# Patient Record
Sex: Male | Born: 1950 | Hispanic: No | Marital: Married | Smoking: Never smoker
Health system: Southern US, Community
[De-identification: ages and names within clinical notes are randomized; demographics above are authoritative.]

## PROBLEM LIST (undated history)

## (undated) DIAGNOSIS — E079 Disorder of thyroid, unspecified: Secondary | ICD-10-CM

## (undated) DIAGNOSIS — K649 Unspecified hemorrhoids: Secondary | ICD-10-CM

## (undated) DIAGNOSIS — M7050 Other bursitis of knee, unspecified knee: Secondary | ICD-10-CM

## (undated) HISTORY — PX: FRACTURE SURGERY: SHX138

## (undated) HISTORY — PX: LACERATION REPAIR: SHX5168

## (undated) HISTORY — DX: Disorder of thyroid, unspecified: E07.9

---

## 2000-04-11 ENCOUNTER — Emergency Department (HOSPITAL_COMMUNITY): Admission: EM | Admit: 2000-04-11 | Discharge: 2000-04-11 | Payer: Self-pay | Admitting: Emergency Medicine

## 2000-04-15 ENCOUNTER — Emergency Department (HOSPITAL_COMMUNITY): Admission: EM | Admit: 2000-04-15 | Discharge: 2000-04-15 | Payer: Self-pay | Admitting: Emergency Medicine

## 2001-08-28 ENCOUNTER — Ambulatory Visit (HOSPITAL_COMMUNITY): Admission: RE | Admit: 2001-08-28 | Discharge: 2001-08-28 | Payer: Self-pay | Admitting: Family Medicine

## 2001-08-28 ENCOUNTER — Encounter: Payer: Self-pay | Admitting: Family Medicine

## 2004-08-18 ENCOUNTER — Ambulatory Visit: Payer: Self-pay | Admitting: Family Medicine

## 2010-04-25 ENCOUNTER — Emergency Department (HOSPITAL_COMMUNITY): Admission: EM | Admit: 2010-04-25 | Discharge: 2010-04-26 | Payer: Self-pay | Admitting: Emergency Medicine

## 2010-05-02 ENCOUNTER — Emergency Department (HOSPITAL_COMMUNITY): Admission: EM | Admit: 2010-05-02 | Discharge: 2010-05-02 | Payer: Self-pay | Admitting: Family Medicine

## 2011-07-29 IMAGING — CR DG HIP COMPLETE 2+V*R*
3 series · 3 of 3 positions shown · non-contrast
Comparison: None.

CLINICAL DATA: 59-year-old male with right hip pain.

RIGHT HIP - COMPLETE 2+ VIEW

[t pelvis a.p.]
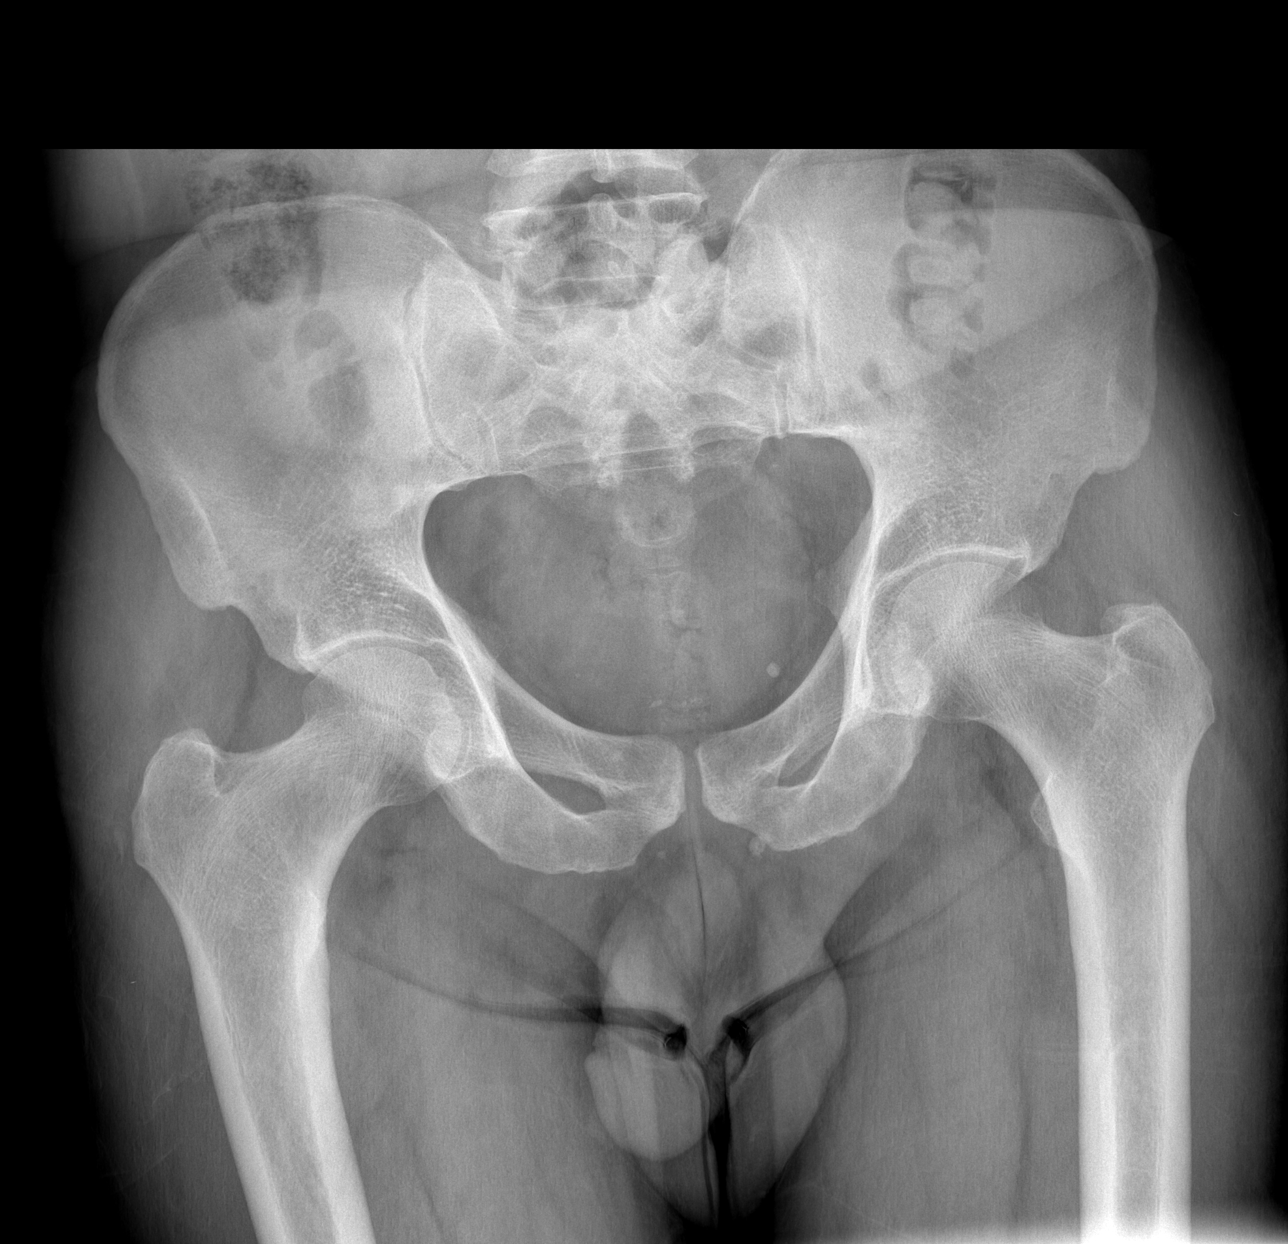

[t hip ap right]
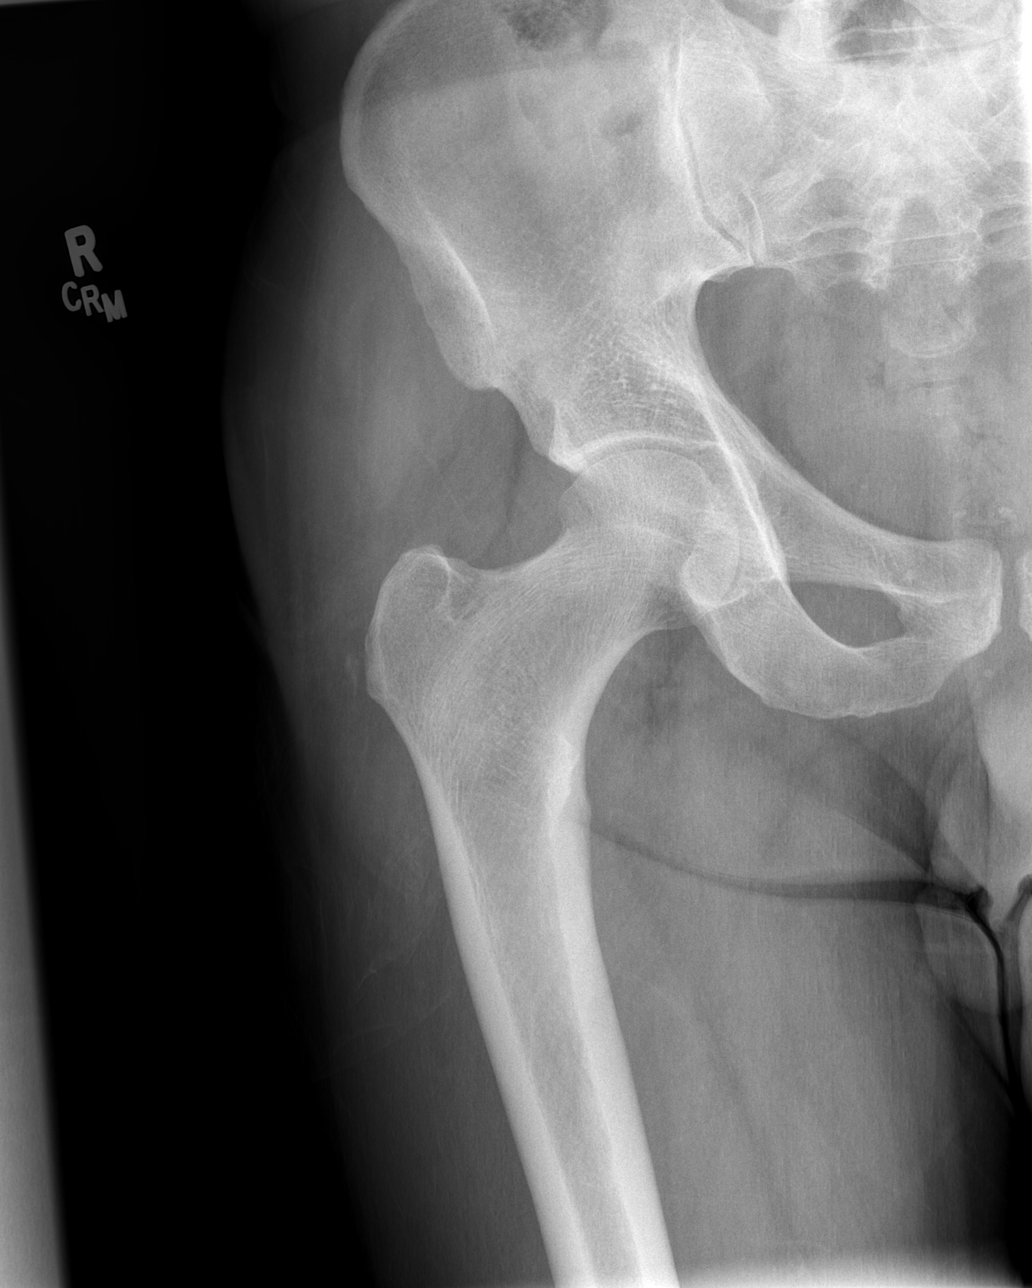

[t hip frog leg right]
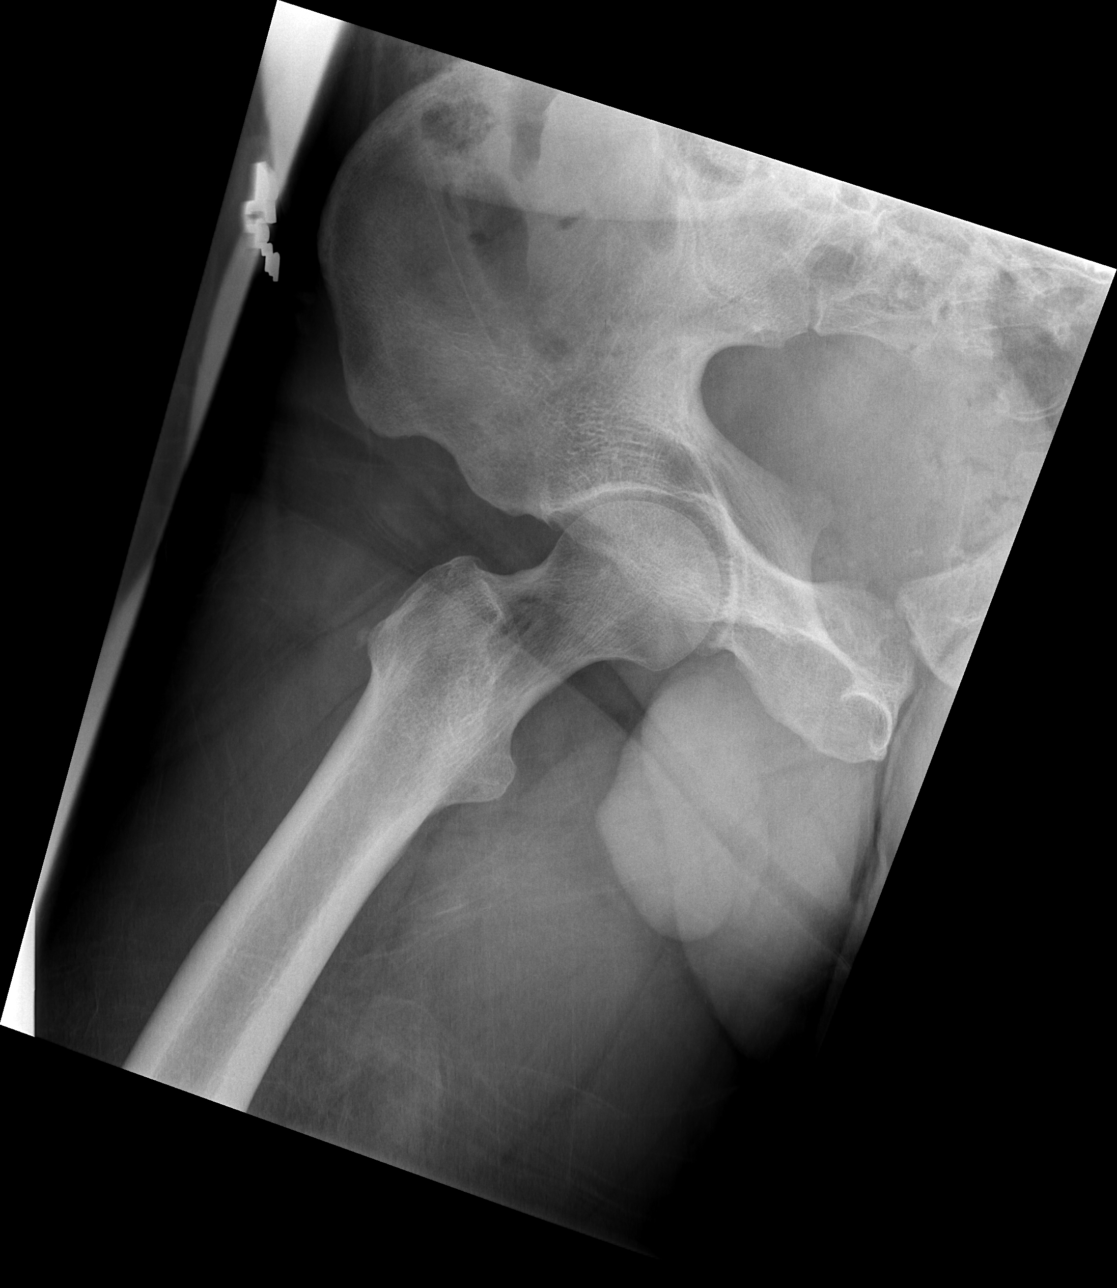

[3 of 3 positions shown; findings below may reference images not displayed]

FINDINGS: Femoral heads are normally located.  Hip joint spaces are
within normal limits.  Pelvic phleboliths. Bone mineralization is
within normal limits. Sacrum and SI joints are within normal
limits.  Proximal visualized right femur appears intact.
IMPRESSION: No acute fracture or dislocation identified about the right hip or
pelvis.

## 2011-10-10 ENCOUNTER — Other Ambulatory Visit: Payer: Self-pay

## 2011-10-10 ENCOUNTER — Encounter (HOSPITAL_COMMUNITY): Payer: Self-pay | Admitting: Anesthesiology

## 2011-10-10 ENCOUNTER — Encounter (HOSPITAL_COMMUNITY): Payer: Self-pay | Admitting: Emergency Medicine

## 2011-10-10 ENCOUNTER — Emergency Department (HOSPITAL_COMMUNITY): Payer: Worker's Compensation | Admitting: Anesthesiology

## 2011-10-10 ENCOUNTER — Ambulatory Visit (HOSPITAL_COMMUNITY)
Admission: EM | Admit: 2011-10-10 | Discharge: 2011-10-11 | Disposition: A | Discharge status: Home / Self Care | Payer: Worker's Compensation | Attending: Orthopedic Surgery | Admitting: Orthopedic Surgery

## 2011-10-10 ENCOUNTER — Emergency Department (HOSPITAL_COMMUNITY): Payer: Worker's Compensation

## 2011-10-10 ENCOUNTER — Encounter (HOSPITAL_COMMUNITY)
Admission: EM | Disposition: A | Discharge status: Home / Self Care | Payer: Self-pay | Source: Home / Self Care | Attending: Emergency Medicine

## 2011-10-10 DIAGNOSIS — Y990 Civilian activity done for income or pay: Secondary | ICD-10-CM | POA: Insufficient documentation

## 2011-10-10 DIAGNOSIS — S61209A Unspecified open wound of unspecified finger without damage to nail, initial encounter: Secondary | ICD-10-CM | POA: Insufficient documentation

## 2011-10-10 DIAGNOSIS — W3189XA Contact with other specified machinery, initial encounter: Secondary | ICD-10-CM | POA: Insufficient documentation

## 2011-10-10 DIAGNOSIS — Y9269 Other specified industrial and construction area as the place of occurrence of the external cause: Secondary | ICD-10-CM | POA: Insufficient documentation

## 2011-10-10 HISTORY — DX: Other bursitis of knee, unspecified knee: M70.50

## 2011-10-10 HISTORY — PX: SKIN DEBRIDEMENT: SHX5235

## 2011-10-10 HISTORY — DX: Unspecified hemorrhoids: K64.9

## 2011-10-10 LAB — CBC
HCT: 39 % (ref 39.0–52.0)
Hemoglobin: 13.8 g/dL (ref 13.0–17.0)
MCH: 30.5 pg (ref 26.0–34.0)
MCHC: 35.4 g/dL (ref 30.0–36.0)
MCV: 86.3 fL (ref 78.0–100.0)
Platelets: 247 10*3/uL (ref 150–400)
RBC: 4.52 MIL/uL (ref 4.22–5.81)
RDW: 12.8 % (ref 11.5–15.5)
WBC: 9.4 10*3/uL (ref 4.0–10.5)

## 2011-10-10 LAB — POCT I-STAT, CHEM 8
BUN: 17 mg/dL (ref 6–23)
Calcium, Ion: 1.22 mmol/L (ref 1.12–1.32)
Chloride: 107 mEq/L (ref 96–112)
Creatinine, Ser: 0.4 mg/dL — ABNORMAL LOW (ref 0.50–1.35)
Glucose, Bld: 110 mg/dL — ABNORMAL HIGH (ref 70–99)
HCT: 41 % (ref 39.0–52.0)
Hemoglobin: 13.9 g/dL (ref 13.0–17.0)
Potassium: 3.9 mEq/L (ref 3.5–5.1)
Sodium: 140 mEq/L (ref 135–145)
TCO2: 24 mmol/L (ref 0–100)

## 2011-10-10 LAB — DIFFERENTIAL
Basophils Absolute: 0 10*3/uL (ref 0.0–0.1)
Basophils Relative: 0 % (ref 0–1)
Eosinophils Absolute: 0.1 10*3/uL (ref 0.0–0.7)
Eosinophils Relative: 1 % (ref 0–5)
Lymphocytes Relative: 22 % (ref 12–46)
Lymphs Abs: 2 10*3/uL (ref 0.7–4.0)
Monocytes Absolute: 0.4 10*3/uL (ref 0.1–1.0)
Monocytes Relative: 4 % (ref 3–12)
Neutro Abs: 6.9 10*3/uL (ref 1.7–7.7)
Neutrophils Relative %: 73 % (ref 43–77)

## 2011-10-10 SURGERY — DEBRIDEMENT, SKIN, FULL-THICKNESS
Anesthesia: General | Site: Finger | Laterality: Right | Wound class: Dirty or Infected

## 2011-10-10 MED ORDER — HYDROMORPHONE HCL PF 1 MG/ML IJ SOLN
1.0000 mg | Freq: Once | INTRAMUSCULAR | Status: AC
  Administered 2011-10-10: 1 mg via INTRAVENOUS
  Filled 2011-10-10: qty 1

## 2011-10-10 MED ORDER — VITAMIN C 500 MG PO TABS
1000.0000 mg | ORAL_TABLET | Freq: Every day | ORAL | Status: DC
  Administered 2011-10-10 – 2011-10-11 (×2): 1000 mg via ORAL
  Filled 2011-10-10 (×2): qty 2

## 2011-10-10 MED ORDER — BUPIVACAINE HCL (PF) 0.25 % IJ SOLN
INTRAMUSCULAR | Status: DC | PRN
  Administered 2011-10-10: 7 mL

## 2011-10-10 MED ORDER — ONDANSETRON HCL 4 MG PO TABS: 4.0000 mg | ORAL_TABLET | Freq: Four times a day (QID) | ORAL | Status: DC | PRN

## 2011-10-10 MED ORDER — HYDROCODONE-ACETAMINOPHEN 5-325 MG PO TABS
1.0000 | ORAL_TABLET | ORAL | Status: DC | PRN
  Administered 2011-10-11: 2 via ORAL
  Filled 2011-10-10: qty 2

## 2011-10-10 MED ORDER — LACTATED RINGERS IV SOLN
INTRAVENOUS | Status: DC | PRN
  Administered 2011-10-10 (×2): via INTRAVENOUS

## 2011-10-10 MED ORDER — ASPIRIN EC 325 MG PO TBEC: 325.0000 mg | DELAYED_RELEASE_TABLET | Freq: Two times a day (BID) | ORAL | Status: AC

## 2011-10-10 MED ORDER — ASPIRIN 325 MG PO TABS
325.0000 mg | ORAL_TABLET | Freq: Two times a day (BID) | ORAL | Status: DC
  Administered 2011-10-10 – 2011-10-11 (×2): 325 mg via ORAL
  Filled 2011-10-10 (×3): qty 1

## 2011-10-10 MED ORDER — DROPERIDOL 2.5 MG/ML IJ SOLN
INTRAMUSCULAR | Status: DC | PRN
  Administered 2011-10-10: .6 mg via INTRAVENOUS

## 2011-10-10 MED ORDER — ONDANSETRON HCL 4 MG/2ML IJ SOLN: 4.0000 mg | Freq: Four times a day (QID) | INTRAMUSCULAR | Status: DC | PRN

## 2011-10-10 MED ORDER — HYDROMORPHONE HCL PF 1 MG/ML IJ SOLN
INTRAMUSCULAR | Status: AC
  Administered 2011-10-10: 1 mg via INTRAVENOUS
  Filled 2011-10-10: qty 1

## 2011-10-10 MED ORDER — DOCUSATE SODIUM 100 MG PO CAPS: 100.0000 mg | ORAL_CAPSULE | Freq: Two times a day (BID) | ORAL | Status: AC

## 2011-10-10 MED ORDER — KCL IN DEXTROSE-NACL 20-5-0.45 MEQ/L-%-% IV SOLN
INTRAVENOUS | Status: DC
  Filled 2011-10-10 (×2): qty 1000

## 2011-10-10 MED ORDER — HYDROMORPHONE HCL PF 1 MG/ML IJ SOLN
1.0000 mg | Freq: Once | INTRAMUSCULAR | Status: AC
  Administered 2011-10-10: 1 mg via INTRAVENOUS

## 2011-10-10 MED ORDER — DOCUSATE SODIUM 100 MG PO CAPS
100.0000 mg | ORAL_CAPSULE | Freq: Two times a day (BID) | ORAL | Status: DC
  Administered 2011-10-10 – 2011-10-11 (×2): 100 mg via ORAL
  Filled 2011-10-10 (×3): qty 1

## 2011-10-10 MED ORDER — HYDROMORPHONE HCL PF 1 MG/ML IJ SOLN: 0.2500 mg | INTRAMUSCULAR | Status: DC | PRN

## 2011-10-10 MED ORDER — DIPHENHYDRAMINE HCL 25 MG PO CAPS: 25.0000 mg | ORAL_CAPSULE | Freq: Four times a day (QID) | ORAL | Status: DC | PRN

## 2011-10-10 MED ORDER — PROPOFOL 10 MG/ML IV BOLUS
INTRAVENOUS | Status: DC | PRN
  Administered 2011-10-10: 200 mg via INTRAVENOUS

## 2011-10-10 MED ORDER — METHOCARBAMOL 100 MG/ML IJ SOLN
500.0000 mg | Freq: Four times a day (QID) | INTRAVENOUS | Status: DC | PRN
  Filled 2011-10-10: qty 5

## 2011-10-10 MED ORDER — CEFAZOLIN SODIUM 1-5 GM-% IV SOLN
1.0000 g | Freq: Once | INTRAVENOUS | Status: AC
  Administered 2011-10-10 (×2): 1 g via INTRAVENOUS
  Filled 2011-10-10: qty 50

## 2011-10-10 MED ORDER — CEPHALEXIN 500 MG PO CAPS: 500.0000 mg | ORAL_CAPSULE | Freq: Four times a day (QID) | ORAL | Status: AC

## 2011-10-10 MED ORDER — MIDAZOLAM HCL 5 MG/5ML IJ SOLN
INTRAMUSCULAR | Status: DC | PRN
  Administered 2011-10-10: 2 mg via INTRAVENOUS

## 2011-10-10 MED ORDER — ONDANSETRON HCL 4 MG/2ML IJ SOLN
INTRAMUSCULAR | Status: DC | PRN
  Administered 2011-10-10: 4 mg via INTRAVENOUS

## 2011-10-10 MED ORDER — CEFAZOLIN SODIUM 1-5 GM-% IV SOLN
1.0000 g | Freq: Three times a day (TID) | INTRAVENOUS | Status: DC
  Administered 2011-10-11: 1 g via INTRAVENOUS
  Filled 2011-10-10 (×4): qty 50

## 2011-10-10 MED ORDER — ZOLPIDEM TARTRATE 5 MG PO TABS: 5.0000 mg | ORAL_TABLET | Freq: Every evening | ORAL | Status: DC | PRN

## 2011-10-10 MED ORDER — ALPRAZOLAM 0.5 MG PO TABS: 0.5000 mg | ORAL_TABLET | Freq: Four times a day (QID) | ORAL | Status: DC | PRN

## 2011-10-10 MED ORDER — HYDROMORPHONE HCL PF 1 MG/ML IJ SOLN
0.5000 mg | INTRAMUSCULAR | Status: DC | PRN
  Administered 2011-10-10 – 2011-10-11 (×2): 1 mg via INTRAVENOUS
  Filled 2011-10-10 (×2): qty 1

## 2011-10-10 MED ORDER — LACTATED RINGERS IV SOLN
INTRAVENOUS | Status: DC
  Administered 2011-10-10: 13:00:00 via INTRAVENOUS

## 2011-10-10 MED ORDER — SODIUM CHLORIDE 0.9 % IV SOLN
Freq: Once | INTRAVENOUS | Status: AC
  Administered 2011-10-10: 09:00:00 via INTRAVENOUS

## 2011-10-10 MED ORDER — ADULT MULTIVITAMIN W/MINERALS CH
1.0000 | ORAL_TABLET | Freq: Every day | ORAL | Status: DC
  Administered 2011-10-11: 1 via ORAL
  Filled 2011-10-10: qty 1

## 2011-10-10 MED ORDER — TETANUS-DIPHTH-ACELL PERTUSSIS 5-2.5-18.5 LF-MCG/0.5 IM SUSP
0.5000 mL | Freq: Once | INTRAMUSCULAR | Status: AC
  Administered 2011-10-10: 0.5 mL via INTRAMUSCULAR
  Filled 2011-10-10: qty 0.5

## 2011-10-10 MED ORDER — FENTANYL CITRATE 0.05 MG/ML IJ SOLN
INTRAMUSCULAR | Status: DC | PRN
  Administered 2011-10-10: 100 ug via INTRAVENOUS

## 2011-10-10 MED ORDER — OXYCODONE-ACETAMINOPHEN 5-325 MG PO TABS: 1.0000 | ORAL_TABLET | ORAL | Status: AC | PRN

## 2011-10-10 MED ORDER — CEFAZOLIN SODIUM 1-5 GM-% IV SOLN
1.0000 g | INTRAVENOUS | Status: AC
  Administered 2011-10-10: 1 g via INTRAVENOUS
  Filled 2011-10-10: qty 50

## 2011-10-10 MED ORDER — METHOCARBAMOL 500 MG PO TABS
500.0000 mg | ORAL_TABLET | Freq: Four times a day (QID) | ORAL | Status: DC | PRN
  Administered 2011-10-10 – 2011-10-11 (×2): 500 mg via ORAL
  Filled 2011-10-10 (×2): qty 1

## 2011-10-10 MED ORDER — OXYCODONE-ACETAMINOPHEN 5-325 MG PO TABS
1.0000 | ORAL_TABLET | ORAL | Status: DC | PRN
  Administered 2011-10-10 – 2011-10-11 (×2): 2 via ORAL
  Filled 2011-10-10 (×3): qty 2

## 2011-10-10 SURGICAL SUPPLY — 54 items
BANDAGE CONFORM 2X5YD N/S (GAUZE/BANDAGES/DRESSINGS) ×2 IMPLANT
BANDAGE ELASTIC 3 VELCRO ST LF (GAUZE/BANDAGES/DRESSINGS) IMPLANT
BANDAGE ELASTIC 4 VELCRO ST LF (GAUZE/BANDAGES/DRESSINGS) IMPLANT
BANDAGE GAUZE ELAST BULKY 4 IN (GAUZE/BANDAGES/DRESSINGS) IMPLANT
BNDG CMPR 9X4 STRL LF SNTH (GAUZE/BANDAGES/DRESSINGS) ×2
BNDG CMPR MD 5X2 ELC HKLP STRL (GAUZE/BANDAGES/DRESSINGS) ×2
BNDG COHESIVE 1X5 TAN STRL LF (GAUZE/BANDAGES/DRESSINGS) ×2 IMPLANT
BNDG ELASTIC 2 VLCR STRL LF (GAUZE/BANDAGES/DRESSINGS) ×3 IMPLANT
BNDG ESMARK 4X9 LF (GAUZE/BANDAGES/DRESSINGS) ×3 IMPLANT
CAP PIN ORTHO PINK (CAP) IMPLANT
CAP PIN PROTECTOR ORTHO WHT (CAP) IMPLANT
CLOTH BEACON ORANGE TIMEOUT ST (SAFETY) ×3 IMPLANT
CORDS BIPOLAR (ELECTRODE) ×3 IMPLANT
COVER SURGICAL LIGHT HANDLE (MISCELLANEOUS) ×5 IMPLANT
CUFF TOURNIQUET SINGLE 18IN (TOURNIQUET CUFF) ×3 IMPLANT
CUFF TOURNIQUET SINGLE 24IN (TOURNIQUET CUFF) IMPLANT
DRAPE OEC MINIVIEW 54X84 (DRAPES) IMPLANT
DRAPE SURG 17X23 STRL (DRAPES) ×3 IMPLANT
DRSG ADAPTIC 3X8 NADH LF (GAUZE/BANDAGES/DRESSINGS) IMPLANT
DRSG EMULSION OIL 3X16 NADH (GAUZE/BANDAGES/DRESSINGS) ×2 IMPLANT
GAUZE SPONGE 2X2 8PLY STRL LF (GAUZE/BANDAGES/DRESSINGS) IMPLANT
GAUZE SPONGE 4X4 12PLY STRL LF (GAUZE/BANDAGES/DRESSINGS) ×2 IMPLANT
GLOVE BIOGEL PI IND STRL 8.5 (GLOVE) ×2 IMPLANT
GLOVE BIOGEL PI INDICATOR 8.5 (GLOVE) ×1
GLOVE SURG ORTHO 8.0 STRL STRW (GLOVE) ×3 IMPLANT
GOWN PREVENTION PLUS XLARGE (GOWN DISPOSABLE) ×3 IMPLANT
GOWN STRL NON-REIN LRG LVL3 (GOWN DISPOSABLE) ×4 IMPLANT
K-WIRE SMTH SNGL TROCAR .028X4 (WIRE)
KIT BASIN OR (CUSTOM PROCEDURE TRAY) ×3 IMPLANT
KIT ROOM TURNOVER OR (KITS) ×1 IMPLANT
KWIRE SMTH SNGL TROCAR .028X4 (WIRE) IMPLANT
MANIFOLD NEPTUNE II (INSTRUMENTS) ×3 IMPLANT
NDL HYPO 25GX1X1/2 BEV (NEEDLE) IMPLANT
NEEDLE HYPO 25GX1X1/2 BEV (NEEDLE) ×3 IMPLANT
NS IRRIG 1000ML POUR BTL (IV SOLUTION) ×3 IMPLANT
PACK ORTHO EXTREMITY (CUSTOM PROCEDURE TRAY) ×3 IMPLANT
PAD ARMBOARD 7.5X6 YLW CONV (MISCELLANEOUS) ×6 IMPLANT
PAD CAST 4YDX4 CTTN HI CHSV (CAST SUPPLIES) IMPLANT
PADDING CAST COTTON 4X4 STRL (CAST SUPPLIES)
PADDING UNDERCAST 2  STERILE (CAST SUPPLIES) ×3 IMPLANT
SOAP 2 % CHG 4 OZ (WOUND CARE) ×3 IMPLANT
SPLINT FINGER 5.25 ALUM (CAST SUPPLIES) ×3
SPLINT FINGER 5/8X5.25 (CAST SUPPLIES) ×1 IMPLANT
SPONGE GAUZE 2X2 STER 10/PKG (GAUZE/BANDAGES/DRESSINGS)
SPONGE GAUZE 4X4 12PLY (GAUZE/BANDAGES/DRESSINGS) ×2 IMPLANT
SUCTION FRAZIER TIP 10 FR DISP (SUCTIONS) ×2 IMPLANT
SUT MERSILENE 4 0 P 3 (SUTURE) IMPLANT
SUT PROLENE 4 0 PS 2 18 (SUTURE) ×6 IMPLANT
SYR CONTROL 10ML LL (SYRINGE) ×2 IMPLANT
TOWEL OR 17X24 6PK STRL BLUE (TOWEL DISPOSABLE) ×3 IMPLANT
TOWEL OR 17X26 10 PK STRL BLUE (TOWEL DISPOSABLE) ×3 IMPLANT
TUBE CONNECTING 12X1/4 (SUCTIONS) ×2 IMPLANT
UNDERPAD 30X30 INCONTINENT (UNDERPADS AND DIAPERS) ×3 IMPLANT
WATER STERILE IRR 1000ML POUR (IV SOLUTION) ×1 IMPLANT

## 2011-10-10 NOTE — ED Notes (Signed)
Family at bedside. 

## 2011-10-10 NOTE — ED Notes (Signed)
PT FAMILY RESTLESS AT BEDSIDE. VERBALIZING CONCERN ABOUT DELAY IN GETTING SURGEON HERE TO SEE PT. REASSURANCE GIVEN. PT IS READY TO GO TO OR . WAITING FOR DR Melvyn Novas TO ARRIVE AND CALL FROM OR.

## 2011-10-10 NOTE — ED Notes (Signed)
LAB AT BEDSIDE TO COLLECT SAMPLES. 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

## 2011-10-10 NOTE — ED Notes (Signed)
AWAITING PT ARRIVAL FROM [place]

## 2011-10-10 NOTE — Anesthesia Postprocedure Evaluation (Signed)
  Anesthesia Post-op Note  Patient: Tyler Reed  Procedure(s) Performed:  DEBRIDEMENT SKIN FULL THICKNESS - Right Index Finger  Patient Location: PACU  Anesthesia Type: General  Level of Consciousness: awake and alert   Airway and Oxygen Therapy: Patient Spontanous Breathing  Post-op Pain: mild  Post-op Assessment: Post-op Vital signs reviewed, Patient's Cardiovascular Status Stable, Respiratory Function Stable, Patent Airway and No signs of Nausea or vomiting  Post-op Vital Signs: Reviewed and stable  Complications: No apparent anesthesia complications

## 2011-10-10 NOTE — ED Provider Notes (Signed)
History     CSN: [id number]  Arrival date & time 10/10/11  4696   None     Chief Complaint  Patient presents with  . Hand Injury    (Consider location/radiation/quality/duration/timing/severity/associated sxs/prior treatment) Patient is a 61 y.o. male presenting with hand injury. The history is provided by the patient.  Hand Injury  The incident occurred less than 1 hour ago. The incident occurred at work. Injury mechanism: right index finger in roller at work. The quality of the pain is described as throbbing. The pain is severe. The pain has been constant since the incident.    No past medical history on file.  No past surgical history on file.  No family history on file.  History  Substance Use Topics  . Smoking status: Not on file  . Smokeless tobacco: Not on file  . Alcohol Use: Not on file      Review of Systems  All other systems reviewed and are negative.    Allergies  Review of patient's allergies indicates not on file.  Home Medications  No current outpatient prescriptions on file.  BP 154/112  Pulse 99  Temp(Src) 98.1 F (36.7 C) (Oral)  Resp 20  SpO2 100%  Physical Exam  Nursing note and vitals reviewed. Constitutional: He is oriented to person, place, and time. He appears well-developed and well-nourished.  HENT:  Head: Normocephalic and atraumatic.  Neck: Normal range of motion. Neck supple.  Cardiovascular: Normal rate and regular rhythm.  Exam reveals no friction rub.   Pulmonary/Chest: Effort normal. No respiratory distress.  Musculoskeletal:       The right index finger has an extensive degloving of the skin, including nail from above the PIP joint.  It appears to be mainly skin but does not involve the tendons, bone.  Neurological: He is alert and oriented to person, place, and time.  Skin: Skin is warm.    ED Course  Procedures (including critical care time)  Labs Reviewed - No data to display No results found.   No  diagnosis found.    MDM  Patient will be transferred to [facility] at the request of Dr. Melvyn Novas.  Has received tetanus immunization, kefzol, and pain meds.          Geoffery Lyons, MD 10/10/11 [phone]

## 2011-10-10 NOTE — ED Notes (Addendum)
Pt arrived with skin tip and nail of right 1st digit in ziploc bag,Pt states right 1st digit caught in roller at work.

## 2011-10-10 NOTE — Anesthesia Preprocedure Evaluation (Addendum)
Anesthesia Evaluation  Patient identified by MRN, date of birth, ID band Patient awake    Reviewed: Allergy & Precautions, H&P , NPO status , Patient's Chart, lab work & pertinent test results  Airway Mallampati: II  Neck ROM: full    Dental  (+) Missing and Dental Advisory Given   Pulmonary          Cardiovascular     Neuro/Psych    GI/Hepatic   Endo/Other    Renal/GU      Musculoskeletal   Abdominal   Peds  Hematology   Anesthesia Other Findings   Reproductive/Obstetrics                          Anesthesia Physical Anesthesia Plan  ASA: I  Anesthesia Plan: General   Post-op Pain Management:    Induction: Intravenous  Airway Management Planned: LMA  Additional Equipment:   Intra-op Plan:   Post-operative Plan:   Informed Consent: I have reviewed the patients History and Physical, chart, labs and discussed the procedure including the risks, benefits and alternatives for the proposed anesthesia with the patient or authorized representative who has indicated his/her understanding and acceptance.     Plan Discussed with: CRNA and Surgeon  Anesthesia Plan Comments:         Anesthesia Quick Evaluation

## 2011-10-10 NOTE — ED Notes (Signed)
DR Melvyn Novas AT BEDSIDE

## 2011-10-10 NOTE — ED Notes (Signed)
PT ASSISTED TO GET UNDRESSED IN PREPARATION FOR OR TODAY. THE DEGLOVED PART IS IN A PLASTIC BAG ON COLD ICED WATER. DR Melvyn Novas IS AWARE OF HOW DEGLOVED PART IS PACKAGED AND IS GOOD WITH THAT.

## 2011-10-10 NOTE — ED Notes (Signed)
REPORT CALLED TO ROBBIE RN

## 2011-10-10 NOTE — ED Provider Notes (Signed)
10:30 AM MSE. Pt arrived from [facility] ED for specialized care from Dr Melvyn Novas for a degloving injury to the right index finger that occurred at work just prior to his arrival at that facility. A&O x 3. Lungs CTAB. Heart RRR. Abd s/NT/ND. Right hand with bulky dressing place, not removed. Degloved fingertip in basin on ice at bedside. Pt in no visible distress. Refuses additional pain medication. Dr Melvyn Novas is being notified of his arrival.     12:30PM Dr Melvyn Novas in ED, speaking with pt and family.  Shaaron Adler, PA-C 10/10/11 1239

## 2011-10-10 NOTE — Transfer of Care (Signed)
Immediate Anesthesia Transfer of Care Note  Patient: Tyler Reed  Procedure(s) Performed:  DEBRIDEMENT SKIN FULL THICKNESS - Right Index Finger  Patient Location: PACU  Anesthesia Type: General  Level of Consciousness: awake  Airway & Oxygen Therapy: Patient Spontanous Breathing  Post-op Assessment: Report given to PACU RN and Post -op Vital signs reviewed and stable  Post vital signs: Reviewed and stable  Complications: No apparent anesthesia complications

## 2011-10-10 NOTE — ED Notes (Signed)
CALLED DR Melvyn Novas TO INQUIRE IF CXR OR ECG OR LABS WOULD BE REQUIRED FOR PT TO GO TO OPERATING ROOM. HE ADVISES "I WILL CALL WHEN I AM DONE". NO ORDERS GIVEN.

## 2011-10-10 NOTE — ED Notes (Signed)
PT HAS ARRIVED FROM [place] VIA [facility]

## 2011-10-10 NOTE — Anesthesia Procedure Notes (Signed)
Procedure Name: LMA Insertion Date/Time: 10/10/2011 3:18 PM Performed by: Alanda Amass Pre-anesthesia Checklist: Patient identified, Timeout performed, Emergency Drugs available, Suction available and Patient being monitored Patient Re-evaluated:Patient Re-evaluated prior to inductionOxygen Delivery Method: Circle System Utilized Preoxygenation: Pre-oxygenation with 100% oxygen Intubation Type: IV induction Ventilation: Mask ventilation without difficulty LMA: LMA inserted LMA Size: 4.0 Number of attempts: 1 Placement Confirmation: positive ETCO2 and breath sounds checked- equal and bilateral Tube secured with: Tape Dental Injury: Teeth and Oropharynx as per pre-operative assessment

## 2011-10-10 NOTE — H&P (Signed)
Raynard Hilaire is an 61 y.o. male.   Chief Complaint: RIGHT INDEX FINGER DEGLOVING HPI: RIGHT INDEX FINGER GOT CAUGHT BETWEEN TWO ROLLERS PT SEEN EVALUATED IN ED TRANSFERRED TO [facility] FOR DEFINITIVE MANAGEMENT RECEIVED TETANUS IV ANTIBIOTICS  History reviewed. No pertinent past medical history.  History reviewed. No pertinent past surgical history.  History reviewed. No pertinent family history. Social History:  reports that he has never smoked. He has never used smokeless tobacco. He reports that he drinks about 2.4 ounces of alcohol per week. He reports that he does not use illicit drugs.  Allergies: No Known Allergies  Medications Prior to Admission  Medication Dose Route Frequency Provider Last Rate Last Dose  . 0.9 %  sodium chloride infusion   Intravenous Once Geoffery Lyons, MD 1,000 mL/hr at 10/10/11 0854    . ceFAZolin (ANCEF) IVPB 1 g/50 mL premix  1 g Intravenous Once Geoffery Lyons, MD   1 g at 10/10/11 0844  . HYDROmorphone (DILAUDID) injection 1 mg  1 mg Intravenous Once Geoffery Lyons, MD   1 mg at 10/10/11 0844  . HYDROmorphone (DILAUDID) injection 1 mg  1 mg Intravenous Once Geoffery Lyons, MD   1 mg at 10/10/11 0917  . HYDROmorphone (DILAUDID) injection 1 mg  1 mg Intravenous Once Geoffery Lyons, MD   1 mg at 10/10/11 1243  . TDaP (BOOSTRIX) injection 0.5 mL  0.5 mL Intramuscular Once Geoffery Lyons, MD   0.5 mL at 10/10/11 0844   No current outpatient prescriptions on file as of 10/10/2011.    Results for orders placed during the hospital encounter of 10/10/11 (from the past 48 hour(s))  CBC     Status: Normal   Collection Time   10/10/11 12:18 PM      Component Value Range Comment   WBC 9.4  4.0 - 10.5 (K/uL)    RBC 4.52  4.22 - 5.81 (MIL/uL)    Hemoglobin 13.8  13.0 - 17.0 (g/dL)    HCT 16.1  09.6 - 04.5 (%)    MCV 86.3  78.0 - 100.0 (fL)    MCH 30.5  26.0 - 34.0 (pg)    MCHC 35.4  30.0 - 36.0 (g/dL)    RDW 40.9  81.1 - 91.4 (%)    Platelets 247  150 - 400 (K/uL)     DIFFERENTIAL     Status: Normal   Collection Time   10/10/11 12:18 PM      Component Value Range Comment   Neutrophils Relative 73  43 - 77 (%)    Neutro Abs 6.9  1.7 - 7.7 (K/uL)    Lymphocytes Relative 22  12 - 46 (%)    Lymphs Abs 2.0  0.7 - 4.0 (K/uL)    Monocytes Relative 4  3 - 12 (%)    Monocytes Absolute 0.4  0.1 - 1.0 (K/uL)    Eosinophils Relative 1  0 - 5 (%)    Eosinophils Absolute 0.1  0.0 - 0.7 (K/uL)    Basophils Relative 0  0 - 1 (%)    Basophils Absolute 0.0  0.0 - 0.1 (K/uL)   POCT I-STAT, CHEM 8     Status: Abnormal   Collection Time   10/10/11 12:34 PM      Component Value Range Comment   Sodium 140  135 - 145 (mEq/L)    Potassium 3.9  3.5 - 5.1 (mEq/L)    Chloride 107  96 - 112 (mEq/L)    BUN 17  6 -  23 (mg/dL)    Creatinine, Ser 8.29 (*) 0.50 - 1.35 (mg/dL)    Glucose, Bld 562 (*) 70 - 99 (mg/dL)    Calcium, Ion 1.30  1.12 - 1.32 (mmol/L)    TCO2 24  0 - 100 (mmol/L)    Hemoglobin 13.9  13.0 - 17.0 (g/dL)    HCT 86.5  78.4 - 69.6 (%)    Dg Hand Complete Right  10/10/2011  *RADIOLOGY REPORT*  Clinical Data: Caught hand in machine rate today  RIGHT HAND - COMPLETE 3+ VIEW  Comparison: None.  Findings: There is marked irregularity and loss of of the soft tissues of the right second digit secondary to the injury. However, no acute fracture is seen.  Alignment is normal.  There are degenerative changes throughout the DIP joints.  Irregularity of the right fifth distal phalanx is due to prior injury.  IMPRESSION:  1.  Loss of much of the soft tissues of the right second digit.  No underlying fracture. 2.  Old deformity of the distal phalanx of the right fifth digit.  Original Report Authenticated By: Juline Patch, M.D.    NO RECENT ILLNESSES OR HOSPITALIZATIONS  Blood pressure 127/71, pulse 65, temperature 98 F (36.7 C), temperature source Oral, resp. rate 14, SpO2 95.00%. General Appearance:  Alert, cooperative, no distress, appears stated age  Head:   Normocephalic, without obvious abnormality, atraumatic  Eyes:  Pupils equal, conjunctiva/corneas clear,         Throat: Lips, mucosa, and tongue normal; teeth and gums normal  Neck: No visible masses     Lungs:   respirations unlabored  Chest Wall:  No tenderness or deformity  Heart:  Regular rate and rhythm,  Abdomen:   Soft, non-tender,         Extremities: RIGHT INDEX FINGER COMPLETE SKIN DEGLOVING FROM TIP TO MP JOINT. FLEXOR AND EXTENSOR MECHANISM INTACT. ABLE TO FLEX DIP AND PIP JOINT SKIN BROUGHT IN AND WELL KEPT AND LOOKS GOOD NO INJURY TO LONG/RING/SMALL  Pulses: 2+ and symmetric  Skin: Skin color, texture, turgor normal, no rashes or lesions     Neurologic: Normal    Assessment/Plan RIGHT INDEX FINGER SKIN DEGLOVING INJURY FROM MP JOINT DISTALLY WITH EXPOSED EXTENSOR AND FLEXOR TENDONS.  RIGHT INDEX DEBRIDEMENT AND APPLICATION OF SKIN GRAFT, AUTOGENOUS VERSUS ALLOGRAFT WILL NEED FURTHER DEFINITIVE SKIN GRAFTING/FULL THICKNESS GRAFTING  R/B/A DISCUSSED WITH PT IN ED.  PT VOICED UNDERSTANDING OF PLAN CONSENT SIGNED DAY OF SURGERY PT SEEN AND EXAMINED PRIOR TO OPERATIVE PROCEDURE/DAY OF SURGERY SITE MARKED. QUESTIONS ANSWERED WILL STAY IN HOSPITAL AFTER SURGERY  Sharma Covert 10/10/2011, 1:00 PM

## 2011-10-10 NOTE — Brief Op Note (Signed)
10/10/2011  4:21 PM  PATIENT:  Tyler Reed  61 y.o. male  PRE-OPERATIVE DIAGNOSIS:  right index finger degloving  POST-OPERATIVE DIAGNOSIS:  right index finger degloving  PROCEDURE:  Procedure(s): DEBRIDEMENT SKIN FULL THICKNESS  SURGEON:  Surgeon(s): Sharma Covert, MD  PHYSICIAN ASSISTANT:   ASSISTANTS: none   ANESTHESIA:   general  EBL:  Total I/O In: 1450 [I.V.:1450] Out: -   BLOOD ADMINISTERED:none  DRAINS: none   LOCAL MEDICATIONS USED:  MARCAINE 7CC  SPECIMEN:  No Specimen  DISPOSITION OF SPECIMEN:  N/A  COUNTS:  YES  TOURNIQUET:   Total Tourniquet Time Documented: Upper Arm (Right) - 29 minutes  DICTATION: .Other Dictation: Dictation Number [id number]  PLAN OF CARE: Admit for overnight observation  PATIENT DISPOSITION:  PACU - hemodynamically stable.   Delay start of Pharmacological VTE agent (>24hrs) due to surgical blood loss or risk of bleeding:  {YES/NO/NOT APPLICABLE:20182

## 2011-10-11 NOTE — ED Provider Notes (Signed)
Medical screening examination/treatment/procedure(s) were conducted as a shared visit with non-physician practitioner(s) and myself.  I personally evaluated the patient during the encounter  Toy Baker, MD 10/11/11 [phone]

## 2011-10-11 NOTE — Op Note (Signed)
NAMEHITOSHI, Tyler Reed NO.:  [id number]  MEDICAL RECORD NO.:  [id number]  LOCATION:  5041                         FACILITY:  [facility]  PHYSICIAN:  Madelynn Done, MD  DATE OF BIRTH:  1950/11/09  DATE OF PROCEDURE:  10/10/2011 DATE OF DISCHARGE:                              OPERATIVE REPORT   PREOPERATIVE DIAGNOSIS:  Right index finger degloving injury.  POSTOPERATIVE DIAGNOSIS:  Right index finger degloving injury.  ATTENDING PHYSICIAN:  Madelynn Done, MD, who scrubbed and present for the entire procedure.  ASSISTANT SURGEON:  None.  ANESTHESIA:  General via LMA.  TOURNIQUET TIME:  Less than 20 minutes at 250 mmHg.  SURGICAL PROCEDURE: 1. Excisional debridement of skin, subcutaneous tissue, and muscle of     right index finger. 2. Right index finger dermal autograft, 8 x 2 cm defect.  SURGICAL INDICATIONS:  Tyler Reed is a right-hand-dominant gentleman who was at work and got his finger caught between a roller, completely degloved the skin.  The patient brought the skin in.  The patient was seen and evaluated in the emergency department.  Given his injuries, the patient is recommended to be taken to the operating room.  Risks, benefits, and alternatives were discussed in detail with the patient and a signed informed consent was obtained.  Risks include but not limited to bleeding, infection, damage to nearby nerves, arteries, or tendons, loss of motion of the wrist and digits, and need for further surgical intervention.  DESCRIPTION OF PROCEDURE:  The patient was appropriately identified in the preop holding area and a mark with permanent marker was made on the right index finger to indicate the correct operative site.  The patient was then brought back to the operating room and placed supine on the anesthesia room table.  General anesthesia was administered.  The patient tolerated this well.  A well-padded tourniquet was then placed on the right  brachium and sealed with 1000 drape.  Right upper extremity was prepped and draped in a normal sterile fashion.  A time-out was called and the correct side was identified and procedure was then begun. Attention was then turned to the right index finger where excisional debridement was then carried out sharply with sharp scissors of the skin, subcutaneous tissue, and portion of the devitalized paratenon and flexor sheath.  Careful attention was made to preserve the pulleys.  The entire flexor sheath was in continuity.  The neurovascular bundles both radially and ulnarly were in continuity.  There was only skin loss.  The extensor mechanism was without defect.  The defect extended from the MP joint all the way to the tip of the finger.  After excisional debridement, removed skin was prepared with sharp defatting taking some of the fat from the skin.  After excision of the fat, the full-thickness graft was then adequately nicely inset over the finger.  This dermal autograft was then sewn down and tacked circumferentially around the finger with 4-0 Prolene suture.  Following this, 7 mL of 0.25% Marcaine was infiltrated locally.  Adaptic dressing was then applied.  A sterile compressive bandage was then applied.  The patient tolerated the procedure  well, was placed in the small finger splint, extubated and taken to the recovery room in good condition.  POSTOPERATIVE PLAN:  The patient is being admitted overnight for IV antibiotics and pain control.  There is a very guarded prognosis on the viability of this autograft.  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  There will have to be a decision made about whether or not the patient wants to keep and maintain the finger which I think is a very reasonable which will be my first party, but he will likely require a full-thickness likely tubularized groin flap in order to cover the soft tissue defect and taking the index finger and putting it  into a pocket and then coming back and dividing the soft tissues in order to give him a nice full-thickness graft.  Again, the neurovascular bundles were in continuity.  The flexor sheath was in good condition.  There were small areas of injury to the flexor sheath distally around the region of the A4 pulley and the extensor mechanism looked pristine.  We will try to arrange the appropriate care.     Madelynn Done, MD     FWO/MEDQ  D:  10/10/2011  T:  10/11/2011  Job:  [id number]

## 2011-10-12 ENCOUNTER — Encounter (HOSPITAL_COMMUNITY): Payer: Self-pay | Admitting: Orthopedic Surgery

## 2011-10-12 MED FILL — KCl 20 MEQ/L (0.15%) in Dextrose 5% & NaCl 0.45% Inj: INTRAVENOUS | Qty: 1000 | Status: AC

## 2013-05-15 ENCOUNTER — Other Ambulatory Visit: Payer: Self-pay | Admitting: Nurse Practitioner

## 2013-05-15 DIAGNOSIS — E0590 Thyrotoxicosis, unspecified without thyrotoxic crisis or storm: Secondary | ICD-10-CM

## 2013-05-19 ENCOUNTER — Ambulatory Visit
Admission: RE | Admit: 2013-05-19 | Discharge: 2013-05-19 | Disposition: A | Discharge status: Home / Self Care | Payer: PRIVATE HEALTH INSURANCE | Source: Ambulatory Visit | Attending: Nurse Practitioner | Admitting: Nurse Practitioner

## 2013-05-19 DIAGNOSIS — E0590 Thyrotoxicosis, unspecified without thyrotoxic crisis or storm: Secondary | ICD-10-CM

## 2013-06-20 ENCOUNTER — Other Ambulatory Visit: Payer: Self-pay | Admitting: Endocrinology

## 2013-06-20 DIAGNOSIS — E0590 Thyrotoxicosis, unspecified without thyrotoxic crisis or storm: Secondary | ICD-10-CM

## 2013-07-07 ENCOUNTER — Encounter (HOSPITAL_COMMUNITY)
Admission: RE | Admit: 2013-07-07 | Discharge: 2013-07-07 | Disposition: A | Discharge status: Home / Self Care | Payer: PRIVATE HEALTH INSURANCE | Source: Ambulatory Visit | Attending: Endocrinology | Admitting: Endocrinology

## 2013-07-07 DIAGNOSIS — E0590 Thyrotoxicosis, unspecified without thyrotoxic crisis or storm: Secondary | ICD-10-CM | POA: Insufficient documentation

## 2013-07-08 ENCOUNTER — Encounter (HOSPITAL_COMMUNITY)
Admission: RE | Admit: 2013-07-08 | Discharge: 2013-07-08 | Disposition: A | Discharge status: Home / Self Care | Payer: PRIVATE HEALTH INSURANCE | Source: Ambulatory Visit | Attending: Endocrinology | Admitting: Endocrinology

## 2013-07-18 ENCOUNTER — Other Ambulatory Visit: Payer: Self-pay | Admitting: Endocrinology

## 2013-07-18 DIAGNOSIS — E0590 Thyrotoxicosis, unspecified without thyrotoxic crisis or storm: Secondary | ICD-10-CM

## 2013-08-07 ENCOUNTER — Encounter (HOSPITAL_COMMUNITY)
Admission: RE | Admit: 2013-08-07 | Discharge: 2013-08-07 | Disposition: A | Discharge status: Home / Self Care | Payer: PRIVATE HEALTH INSURANCE | Source: Ambulatory Visit | Attending: Endocrinology | Admitting: Endocrinology

## 2013-08-07 DIAGNOSIS — E0590 Thyrotoxicosis, unspecified without thyrotoxic crisis or storm: Secondary | ICD-10-CM

## 2013-08-07 MED ORDER — SODIUM IODIDE I 131 CAPSULE
16.0000 | Freq: Once | INTRAVENOUS | Status: AC | PRN
  Administered 2013-08-07: 17.1 via ORAL

## 2014-10-10 IMAGING — NM NM THYROID UPTAKE SINGLE (24 HR)
1 series · 1 of 1 positions shown · non-contrast
Comparison: None.

RADIOPHARMACEUTICALS:  Fifteen uXi9-PGP Lazy

CLINICAL DATA: Hyperthyroidism.  TSH equal

EXAM:
ALON THYROID UPTAKE (24 HOURS)
TECHNIQUE: Following the oral administration of the radiopharmaceutical, 24
hour radioactive iodine uptake was calculated with the uptake probe
entered on the neck.

[static - general purpose · 1 of 1 slices shown]
[im 1/1]
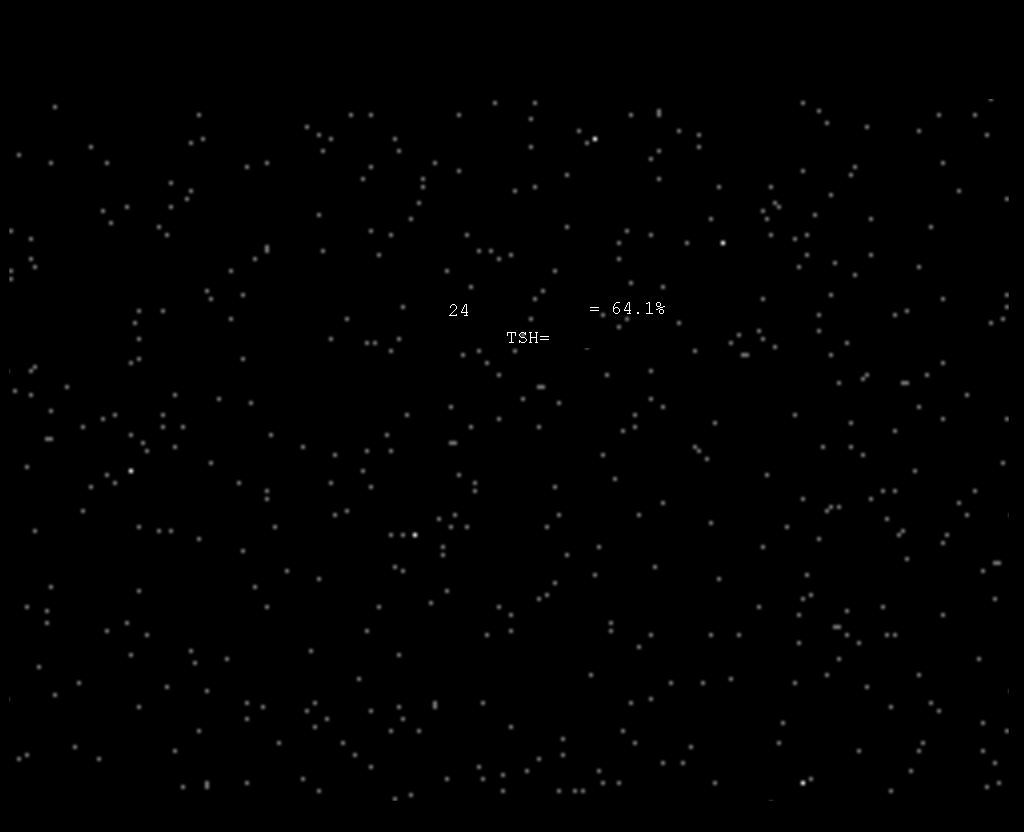

[1 of 1 positions shown; findings below may reference images not displayed]

FINDINGS: 24 hour I 131 uptake = 64.1% (normal 10-30%)
IMPRESSION: 24 hour I 131 uptake = 64.1% (normal 10-30%)

## 2014-11-09 IMAGING — NM NM RAI THERAPY FOR HYPERTHYROIDISM
1 series · 1 of 1 positions shown · non-contrast
Comparison: none

CLINICAL DATA: Hyperthyroidism

EXAM:
RADIOACTIVE IODINE THERAPY FOR HYPERTHYROIDISM
TECHNIQUE: Radioactive iodine prescribed by myself. The risks and benefits of
radioactive iodine therapy were discussed with the patient in detail
by Dr. Quirijn. Alternative therapies were also mentioned.
Radiation safety was discussed with the patient, including how to
protect the general public from exposure. There were no barriers to
communication. Written consent was obtained. The patient then
received a capsule containing the radiopharmaceutical.
The patient will follow-up with the referring physician.
RADIOPHARMACEUTICALS:  17.1 mCi Q-TXT sodium iodide.

[Series 0: (id) static · 1.17mm/px · 1 of 1 slices shown]
[im 1/1]
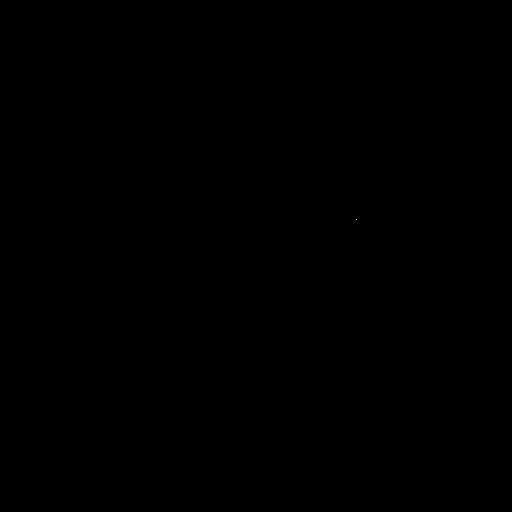

[1 of 1 positions shown; findings below may reference images not displayed]

IMPRESSION: Per oral administration of Q-TXT sodium iodide for the treatment of
hyperthyroidism.

## 2017-06-03 ENCOUNTER — Encounter: Payer: Self-pay | Admitting: Physician Assistant

## 2017-06-03 ENCOUNTER — Ambulatory Visit (INDEPENDENT_AMBULATORY_CARE_PROVIDER_SITE_OTHER): Payer: Managed Care, Other (non HMO)

## 2017-06-03 ENCOUNTER — Ambulatory Visit (INDEPENDENT_AMBULATORY_CARE_PROVIDER_SITE_OTHER): Payer: Managed Care, Other (non HMO) | Admitting: Physician Assistant

## 2017-06-03 VITALS — BP 142/78 | HR 64 | Temp 98.3°F | Resp 18 | Ht 61.42 in | Wt 164.2 lb

## 2017-06-03 DIAGNOSIS — M25561 Pain in right knee: Secondary | ICD-10-CM

## 2017-06-03 DIAGNOSIS — M25461 Effusion, right knee: Secondary | ICD-10-CM | POA: Diagnosis not present

## 2017-06-03 NOTE — Progress Notes (Signed)
    06/03/2017 10:59 AM   DOB: [birth year] / MRN: [id number]  SUBJECTIVE:  Tyler Reed is a 66 y.o. male presenting for right knee pain that started 4 days ago.  Tells me he has had an effusion in the past.  He denies pain at this time. Denies history a history gout and no fever with this.  Did try to put osme fresh cabbage on the knee and this seemed help.    He has No Known Allergies.   He  has a past medical history of Bursitis, knee; Hemorrhoid; and Thyroid disease.    He  reports that he has never smoked. He has never used smokeless tobacco. He reports that he drinks about 2.4 oz of alcohol per week . He reports that he does not use drugs. He  reports that he currently engages in sexual activity. The patient  has a past surgical history that includes Skin debridement (10/10/11); Laceration repair; Skin debridement (10/10/2011); and Fracture surgery.  His family history is not on file.  ROS  The problem list and medications were reviewed and updated by myself where necessary and exist elsewhere in the encounter.   OBJECTIVE:  BP (!) 142/78   Pulse 64   Temp 98.3 F (36.8 C) (Oral)   Resp 18   Ht 5' 1.42" (1.56 m)   Wt 164 lb 3.2 oz (74.5 kg)   SpO2 96%   BMI 30.61 kg/m   Lab Results  Component Value Date   CREATININE 0.40 (L) 10/10/2011     Physical Exam  Constitutional: He appears well-developed. He is active and cooperative.  Non-toxic appearance.  Cardiovascular: Normal rate, regular rhythm, S1 normal, S2 normal, normal heart sounds, intact distal pulses and normal pulses.  Exam reveals no gallop and no friction rub.   No murmur heard. Pulmonary/Chest: Effort normal. No tachypnea. He has no rales.  Abdominal: He exhibits no distension.  Musculoskeletal: He exhibits no edema.  Neurological: He is alert.  Skin: Skin is warm and dry. He is not diaphoretic. No pallor.  Vitals reviewed.   No results found for this or any previous visit (from the past 72 hour(s)).  Dg  Knee Complete 4 Views Right  Result Date: 06/03/2017 CLINICAL DATA:  Right knee pain and swelling EXAM: RIGHT KNEE - COMPLETE 4+ VIEW COMPARISON:  04/26/2010 FINDINGS: No evidence of fracture, dislocation, or joint effusion. No evidence of arthropathy or other focal bone abnormality. Soft tissues are unremarkable. IMPRESSION: No acute osseous finding Electronically Signed   By: Judie Petit.  Shick M.D.   On: 06/03/2017 10:46    ASSESSMENT AND PLAN:  Derion was seen today for knee pain.  Diagnoses and all orders for this visit:  Pain and swelling of knee, right: This has largely resolved with the application of cabbage.  I have advised that he continue this practice, and to use Ibuprofen per avs.  -     DG Knee Complete 4 Views Right    The patient is advised to call or return to clinic if he does not see an improvement in symptoms, or to seek the care of the closest emergency department if he worsens with the above plan.   Deliah Boston, MHS, PA-C [facility] 06/03/2017 10:59 AM

## 2017-06-03 NOTE — Patient Instructions (Addendum)
   tyake 200-600 mg of Ibuprofen every 8 hours as needed for pain and swelling.    IF you received an x-ray today, you will receive an invoice from [facility]. Please contact [facility] at [phone] with questions or concerns regarding your invoice.   IF you received labwork today, you will receive an invoice from [facility]. Please contact [facility] at [phone] with questions or concerns regarding your invoice.   Our billing staff will not be able to assist you with questions regarding bills from these companies.  You will be contacted with the lab results as soon as they are available. The fastest way to get your results is to activate your My Chart account. Instructions are located on the last page of this paperwork. If you have not heard from [place] regarding the results in 2 weeks, please contact this office.

## 2017-06-13 ENCOUNTER — Encounter: Payer: Managed Care, Other (non HMO) | Admitting: Physician Assistant

## 2017-06-13 ENCOUNTER — Telehealth: Payer: Self-pay | Admitting: Physician Assistant

## 2017-06-13 ENCOUNTER — Encounter: Payer: Self-pay | Admitting: Physician Assistant

## 2017-06-13 ENCOUNTER — Ambulatory Visit (INDEPENDENT_AMBULATORY_CARE_PROVIDER_SITE_OTHER): Payer: Managed Care, Other (non HMO) | Admitting: Physician Assistant

## 2017-06-13 VITALS — BP 138/76 | HR 63 | Temp 98.0°F | Resp 16 | Ht 61.0 in | Wt 166.0 lb

## 2017-06-13 DIAGNOSIS — Z125 Encounter for screening for malignant neoplasm of prostate: Secondary | ICD-10-CM

## 2017-06-13 DIAGNOSIS — Z0000 Encounter for general adult medical examination without abnormal findings: Secondary | ICD-10-CM

## 2017-06-13 DIAGNOSIS — Z130 Encounter for screening for diseases of the blood and blood-forming organs and certain disorders involving the immune mechanism: Secondary | ICD-10-CM

## 2017-06-13 DIAGNOSIS — Z13220 Encounter for screening for lipoid disorders: Secondary | ICD-10-CM

## 2017-06-13 DIAGNOSIS — Z1159 Encounter for screening for other viral diseases: Secondary | ICD-10-CM | POA: Diagnosis not present

## 2017-06-13 DIAGNOSIS — Z1211 Encounter for screening for malignant neoplasm of colon: Secondary | ICD-10-CM | POA: Diagnosis not present

## 2017-06-13 DIAGNOSIS — K649 Unspecified hemorrhoids: Secondary | ICD-10-CM | POA: Diagnosis not present

## 2017-06-13 DIAGNOSIS — Z13228 Encounter for screening for other metabolic disorders: Secondary | ICD-10-CM | POA: Diagnosis not present

## 2017-06-13 MED ORDER — HYDROCORTISONE ACETATE 25 MG RE SUPP: 25.0000 mg | Freq: Two times a day (BID) | RECTAL | 0 refills | Status: DC

## 2017-06-13 NOTE — Progress Notes (Signed)
PRIMARY CARE AT [facility] [address], [place] [address] [phone]  Date:  06/13/2017   Name:  Tyler Reed   DOB:  [birth year]   MRN:  [id number]  PCP:  Lorne Skeens, MD    History of Present Illness:  Tyler Reed is a 66 y.o. male patient who presents to PCP with  Chief Complaint  Patient presents with  . Annual Exam     DIET: "sweet, salt" , he eats greasy foods.  No fast foods or eating out.  Some soda with mixed drinks.  Water intake is rare.  1 bottle of water  BM: normal.  He will have bloody stool.  He will sit for awhile with defecation, but not constipated.    URINATION: he has normal urination.  No dysuria, frequency, or hematuria.    SLEEP: good.  8-9 hours of sleep.    SOCIAL ACTIVITY Fishing, working in the yard. EtOH: 2 beers per day Illicit drug use: none Tobacco or vaping: none.  Never smoker.   No history of cancer in the family.    There are no active problems to display for this patient.   Past Medical History:  Diagnosis Date  . Bursitis, knee    right  . Hemorrhoid   . Thyroid disease     Past Surgical History:  Procedure Laterality Date  . FRACTURE SURGERY    . LACERATION REPAIR     reattached right "pinky"  . SKIN DEBRIDEMENT  10/10/11   full thickness; degloving right index finger  . SKIN DEBRIDEMENT  10/10/2011   Procedure: DEBRIDEMENT SKIN FULL THICKNESS;  Surgeon: Linna Hoff, MD;  Location: [facility];  Service: Orthopedics;  Laterality: Right;  Right Index Finger    Social History  Substance Use Topics  . Smoking status: Never Smoker  . Smokeless tobacco: Never Used  . Alcohol use 2.4 oz/week    4 Cans of beer per week     Comment: on weekends    History reviewed. No pertinent family history.  No Known Allergies  Medication list has been reviewed and updated.  Current Outpatient Prescriptions on File Prior to Visit  Medication Sig Dispense Refill  . SYNTHROID 100 MCG tablet TAKE 1/2 TABLET IN AM ON EMPTY STOMACH  ONCE A DAY FOR THYROID (NAME BRAND ONLY DAW)  11   No current facility-administered medications on file prior to visit.     ROS ROS otherwise unremarkable unless listed above.  Physical Examination: BP 138/76   Pulse 63   Temp 98 F (36.7 C) (Oral)   Resp 16   Ht 5' 1"  (1.549 m)   Wt 166 lb (75.3 kg)   SpO2 99%   BMI 31.37 kg/m  Ideal Body Weight: Weight in (lb) to have BMI = 25: 132  Physical Exam  Constitutional: He is oriented to person, place, and time. He appears well-developed and well-nourished. No distress.  HENT:  Head: Normocephalic and atraumatic.  Right Ear: Tympanic membrane, external ear and ear canal normal.  Left Ear: Tympanic membrane, external ear and ear canal normal.  Eyes: Pupils are equal, round, and reactive to light. Conjunctivae and EOM are normal.  Cardiovascular: Normal rate and regular rhythm.  Exam reveals no friction rub.   No murmur heard. Pulmonary/Chest: Effort normal. No respiratory distress. He has no wheezes.  Abdominal: Soft. Bowel sounds are normal. He exhibits no distension and no mass. There is no tenderness.  Genitourinary: Rectal exam shows external hemorrhoid. Prostate is enlarged (no  nodules detected, and normal bogginess). Prostate is not tender.  Musculoskeletal: Normal range of motion. He exhibits no edema or tenderness.  Neurological: He is alert and oriented to person, place, and time. He displays normal reflexes.  Skin: Skin is warm and dry. He is not diaphoretic.  Psychiatric: He has a normal mood and affect. His behavior is normal.    Assessment and Plan: Tyler Reed is a 66 y.o. male who is here today for cc of  Chief Complaint  Patient presents with  . Annual Exam  discussed the use of the anusol Reports that he may have had a colonoscopy.  Will address today with gastroenterology colonoscopy.  Also placed a note to see if we can retrieve any info on last colonoscopy. Annual physical exam - Plan: CBC, CMP14+EGFR, Lipid  panel, PSA, Hepatitis C antibody, Ambulatory referral to Gastroenterology  Hemorrhoids, unspecified hemorrhoid type - Plan: hydrocortisone (ANUSOL-HC) 25 MG suppository  Special screening for malignant neoplasms, colon - Plan: Ambulatory referral to Gastroenterology  Screening for lipid disorders - Plan: Lipid panel  Screening for deficiency anemia - Plan: CBC  Screening for metabolic disorder - Plan: CMP14+EGFR  Screening for prostate cancer - Plan: PSA  Need for hepatitis C screening test - Plan: Hepatitis C antibody  Ivar Drape, PA-C [facility] 10/17/20182:55 PM

## 2017-06-13 NOTE — Telephone Encounter (Signed)
Please see if this patient had a colonoscopy with procedure location.  Is there a colonoscopy location that may not have it entered in epic.  He may have had a colonoscopy in [facility]

## 2017-06-13 NOTE — Patient Instructions (Signed)
Please await contact for the colonoscopy.   Keeping you healthy  Get these tests  Blood pressure- Have your blood pressure checked once a year by your healthcare provider.  Normal blood pressure is 120/80  Weight- Have your body mass index (BMI) calculated to screen for obesity.  BMI is a measure of body fat based on height and weight. You can also calculate your own BMI at [email or web address].  Cholesterol- Have your cholesterol checked every year.  Diabetes- Have your blood sugar checked regularly if you have high blood pressure, high cholesterol, have a family history of diabetes or if you are overweight.  Screening for Colon Cancer- Colonoscopy starting at age 64.  Screening may begin sooner depending on your family history and other health conditions. Follow up colonoscopy as directed by your Gastroenterologist.  Screening for Prostate Cancer- Both blood work (PSA) and a rectal exam help screen for Prostate Cancer.  Screening begins at age 21 with African-American men and at age 76 with Caucasian men.  Screening may begin sooner depending on your family history.  Take these medicines  Aspirin- One aspirin daily can help prevent Heart disease and Stroke.  Flu shot- Every fall.  Tetanus- Every 10 years.  Zostavax- Once after the age of 56 to prevent Shingles.  Pneumonia shot- Once after the age of 47; if you are younger than 47, ask your healthcare provider if you need a Pneumonia shot.  Take these steps  Don't smoke- If you do smoke, talk to your doctor about quitting.  For tips on how to quit, go to www.smokefree.gov or call 1-800-QUIT-NOW.  Be physically active- Exercise 5 days a week for at least 30 minutes.  If you are not already physically active start slow and gradually work up to 30 minutes of moderate physical activity.  Examples of moderate activity include walking briskly, mowing the yard, dancing, swimming, bicycling, etc.  Eat a healthy diet- Eat a variety of  healthy food such as fruits, vegetables, low fat milk, low fat cheese, yogurt, lean meant, poultry, fish, beans, tofu, etc. For more information go to www.thenutritionsource.org  Drink alcohol in moderation- Limit alcohol intake to less than two drinks a day. Never drink and drive.  Dentist- Brush and floss twice daily; visit your dentist twice a year.  Depression- Your emotional health is as important as your physical health. If you're feeling down, or losing interest in things you would normally enjoy please talk to your healthcare provider.  Eye exam- Visit your eye doctor every year.  Safe sex- If you may be exposed to a sexually transmitted infection, use a condom.  Seat belts- Seat belts can save your life; always wear one.  Smoke/Carbon Monoxide detectors- These detectors need to be installed on the appropriate level of your home.  Replace batteries at least once a year.  Skin cancer- When out in the sun, cover up and use sunscreen 15 SPF or higher.  Violence- If anyone is threatening you, please tell your healthcare provider.  Living Will/ Health care power of attorney- Speak with your healthcare provider and family.

## 2017-06-14 LAB — LIPID PANEL
Chol/HDL Ratio: 3.3 ratio (ref 0.0–5.0)
Cholesterol, Total: 235 mg/dL — ABNORMAL HIGH (ref 100–199)
HDL: 72 mg/dL (ref >39)
LDL Calculated: 140 mg/dL — ABNORMAL HIGH (ref 0–99)
Triglycerides: 115 mg/dL (ref 0–149)
VLDL Cholesterol Cal: 23 mg/dL (ref 5–40)

## 2017-06-14 LAB — CMP14+EGFR
ALT: 22 IU/L (ref 0–44)
AST: 21 IU/L (ref 0–40)
Albumin/Globulin Ratio: 1.9 (ref 1.2–2.2)
Albumin: 4.9 g/dL — ABNORMAL HIGH (ref 3.6–4.8)
Alkaline Phosphatase: 50 IU/L (ref 39–117)
BUN/Creatinine Ratio: 13 (ref 10–24)
BUN: 12 mg/dL (ref 8–27)
Bilirubin Total: 0.4 mg/dL (ref 0.0–1.2)
CO2: 24 mmol/L (ref 20–29)
Calcium: 10 mg/dL (ref 8.6–10.2)
Chloride: 100 mmol/L (ref 96–106)
Creatinine, Ser: 0.9 mg/dL (ref 0.76–1.27)
GFR calc Af Amer: 104 mL/min/{1.73_m2} (ref >59)
GFR calc non Af Amer: 90 mL/min/{1.73_m2} (ref >59)
Globulin, Total: 2.6 g/dL (ref 1.5–4.5)
Glucose: 97 mg/dL (ref 65–99)
Potassium: 4.3 mmol/L (ref 3.5–5.2)
Sodium: 138 mmol/L (ref 134–144)
Total Protein: 7.5 g/dL (ref 6.0–8.5)

## 2017-06-14 LAB — HEPATITIS C ANTIBODY: Hep C Virus Ab: <0.1 s/co ratio (ref 0.0–0.9)

## 2017-06-14 LAB — CBC
Hematocrit: 43.6 % (ref 37.5–51.0)
Hemoglobin: 14.3 g/dL (ref 13.0–17.7)
MCH: 28.1 pg (ref 26.6–33.0)
MCHC: 32.8 g/dL (ref 31.5–35.7)
MCV: 86 fL (ref 79–97)
Platelets: 313 10*3/uL (ref 150–379)
RBC: 5.09 x10E6/uL (ref 4.14–5.80)
RDW: 14.8 % (ref 12.3–15.4)
WBC: 6.6 10*3/uL (ref 3.4–10.8)

## 2017-06-14 LAB — PSA: Prostate Specific Ag, Serum: 0.6 ng/mL (ref 0.0–4.0)

## 2017-08-19 ENCOUNTER — Encounter: Payer: Self-pay | Admitting: Physician Assistant

## 2017-12-02 ENCOUNTER — Encounter: Payer: Self-pay | Admitting: Physician Assistant

## 2019-12-08 ENCOUNTER — Other Ambulatory Visit: Payer: Self-pay

## 2019-12-08 ENCOUNTER — Ambulatory Visit: Payer: Managed Care, Other (non HMO) | Admitting: Adult Health Nurse Practitioner

## 2019-12-08 VITALS — BP 140/74 | HR 72 | Temp 97.9°F | Resp 15 | Ht 61.0 in | Wt 167.8 lb

## 2019-12-08 DIAGNOSIS — M549 Dorsalgia, unspecified: Secondary | ICD-10-CM

## 2019-12-08 MED ORDER — METHYLPREDNISOLONE 4 MG PO TBPK: ORAL_TABLET | ORAL | 0 refills | Status: DC

## 2019-12-08 MED ORDER — KETOROLAC TROMETHAMINE 60 MG/2ML IM SOLN
60.0000 mg | Freq: Once | INTRAMUSCULAR | Status: AC
  Administered 2019-12-08: 60 mg via INTRAMUSCULAR

## 2019-12-08 NOTE — Progress Notes (Unsigned)
SUBJECTIVE:  Tyler Reed is a 69 y.o. male who complains of low back pain for 9 day(s), positional with bending or lifting, without radiation down the legs. Precipitating factors: none recalled by the patient. Prior history of back problems: recurrent self limited episodes of low back pain in the past. There is no numbness in the legs.  OBJECTIVE: BP 140/74   Pulse 72   Temp 97.9 F (36.6 C) (Temporal)   Resp 15   Ht 5\' 1"  (1.549 m)   Wt 167 lb 12.8 oz (76.1 kg)   SpO2 99%   BMI 31.71 kg/m   Patient appears to be in mild to moderate pain, antalgic gait noted. Lumbosacral spine area reveals no local tenderness or mass.  Painful and reduced LS ROM noted. Straight leg raise is {pos_neg:315050} at *** degrees on {gen laterality:315002}. DTR's, motor strength and sensation normal, including heel and toe gait.  Peripheral pulses are palpable. X-Ray: {xr findings:315769}.  ASSESSMENT:  {back pain dx:315351::"lumbar strain"}  PLAN: For acute pain, rest, intermittent application of heat (do not sleep on heating pad), analgesics and muscle relaxants are recommended. Discussed longer term treatment plan of prn NSAID's and discussed a home back care exercise program with flexion exercise routine. Proper lifting with avoidance of heavy lifting discussed. Consider Physical Therapy and XRay studies if not improving. Call or return to clinic prn if these symptoms worsen or fail to improve as anticipated.

## 2019-12-08 NOTE — Patient Instructions (Addendum)

## 2019-12-17 ENCOUNTER — Ambulatory Visit: Payer: Managed Care, Other (non HMO) | Admitting: Adult Health Nurse Practitioner

## 2020-02-02 ENCOUNTER — Encounter: Payer: Managed Care, Other (non HMO) | Admitting: Registered Nurse

## 2020-09-25 ENCOUNTER — Ambulatory Visit (INDEPENDENT_AMBULATORY_CARE_PROVIDER_SITE_OTHER): Payer: Managed Care, Other (non HMO)

## 2020-09-25 ENCOUNTER — Inpatient Hospital Stay (HOSPITAL_COMMUNITY)
Admission: EM | Admit: 2020-09-25 | Discharge: 2020-09-27 | DRG: 177 | Disposition: A | Discharge status: Home / Self Care | Payer: Managed Care, Other (non HMO) | Source: Ambulatory Visit | Attending: Internal Medicine | Admitting: Internal Medicine

## 2020-09-25 ENCOUNTER — Encounter: Payer: Self-pay | Admitting: Emergency Medicine

## 2020-09-25 ENCOUNTER — Ambulatory Visit
Admission: EM | Admit: 2020-09-25 | Discharge: 2020-09-25 | Disposition: A | Discharge status: Home / Self Care | Payer: Managed Care, Other (non HMO) | Attending: Urgent Care | Admitting: Urgent Care

## 2020-09-25 ENCOUNTER — Other Ambulatory Visit: Payer: Self-pay

## 2020-09-25 ENCOUNTER — Emergency Department (HOSPITAL_COMMUNITY): Payer: Managed Care, Other (non HMO)

## 2020-09-25 DIAGNOSIS — J9600 Acute respiratory failure, unspecified whether with hypoxia or hypercapnia: Secondary | ICD-10-CM

## 2020-09-25 DIAGNOSIS — R0602 Shortness of breath: Secondary | ICD-10-CM

## 2020-09-25 DIAGNOSIS — Z6835 Body mass index (BMI) 35.0-35.9, adult: Secondary | ICD-10-CM

## 2020-09-25 DIAGNOSIS — R52 Pain, unspecified: Secondary | ICD-10-CM

## 2020-09-25 DIAGNOSIS — D649 Anemia, unspecified: Secondary | ICD-10-CM

## 2020-09-25 DIAGNOSIS — R42 Dizziness and giddiness: Secondary | ICD-10-CM | POA: Diagnosis not present

## 2020-09-25 DIAGNOSIS — J1282 Pneumonia due to coronavirus disease 2019: Secondary | ICD-10-CM | POA: Diagnosis present

## 2020-09-25 DIAGNOSIS — J189 Pneumonia, unspecified organism: Secondary | ICD-10-CM

## 2020-09-25 DIAGNOSIS — K209 Esophagitis, unspecified without bleeding: Secondary | ICD-10-CM | POA: Diagnosis present

## 2020-09-25 DIAGNOSIS — U071 COVID-19: Secondary | ICD-10-CM | POA: Diagnosis not present

## 2020-09-25 DIAGNOSIS — J188 Other pneumonia, unspecified organism: Secondary | ICD-10-CM

## 2020-09-25 DIAGNOSIS — J9601 Acute respiratory failure with hypoxia: Secondary | ICD-10-CM

## 2020-09-25 DIAGNOSIS — R0902 Hypoxemia: Secondary | ICD-10-CM

## 2020-09-25 DIAGNOSIS — B349 Viral infection, unspecified: Secondary | ICD-10-CM

## 2020-09-25 DIAGNOSIS — E119 Type 2 diabetes mellitus without complications: Secondary | ICD-10-CM | POA: Diagnosis present

## 2020-09-25 DIAGNOSIS — E669 Obesity, unspecified: Secondary | ICD-10-CM | POA: Diagnosis present

## 2020-09-25 DIAGNOSIS — E039 Hypothyroidism, unspecified: Secondary | ICD-10-CM

## 2020-09-25 DIAGNOSIS — E876 Hypokalemia: Secondary | ICD-10-CM

## 2020-09-25 LAB — CBC WITH DIFFERENTIAL/PLATELET
Abs Immature Granulocytes: 0.03 10*3/uL (ref 0.00–0.07)
Basophils Absolute: 0 10*3/uL (ref 0.0–0.1)
Basophils Relative: 0 %
Eosinophils Absolute: 0 10*3/uL (ref 0.0–0.5)
Eosinophils Relative: 0 %
HCT: 37.2 % — ABNORMAL LOW (ref 39.0–52.0)
Hemoglobin: 11.7 g/dL — ABNORMAL LOW (ref 13.0–17.0)
Immature Granulocytes: 1 %
Lymphocytes Relative: 12 %
Lymphs Abs: 0.7 10*3/uL (ref 0.7–4.0)
MCH: 26.7 pg (ref 26.0–34.0)
MCHC: 31.5 g/dL (ref 30.0–36.0)
MCV: 84.9 fL (ref 80.0–100.0)
Monocytes Absolute: 0.3 10*3/uL (ref 0.1–1.0)
Monocytes Relative: 4 %
Neutro Abs: 5.1 10*3/uL (ref 1.7–7.7)
Neutrophils Relative %: 83 %
Platelets: 345 10*3/uL (ref 150–400)
RBC: 4.38 MIL/uL (ref 4.22–5.81)
RDW: 13.4 % (ref 11.5–15.5)
WBC: 6.2 10*3/uL (ref 4.0–10.5)
nRBC: 0 % (ref 0.0–0.2)

## 2020-09-25 LAB — COMPREHENSIVE METABOLIC PANEL
ALT: 29 U/L (ref 0–44)
AST: 47 U/L — ABNORMAL HIGH (ref 15–41)
Albumin: 3 g/dL — ABNORMAL LOW (ref 3.5–5.0)
Alkaline Phosphatase: 37 U/L — ABNORMAL LOW (ref 38–126)
Anion gap: 13 (ref 5–15)
BUN: 16 mg/dL (ref 8–23)
CO2: 25 mmol/L (ref 22–32)
Calcium: 8.4 mg/dL — ABNORMAL LOW (ref 8.9–10.3)
Chloride: 98 mmol/L (ref 98–111)
Creatinine, Ser: 0.93 mg/dL (ref 0.61–1.24)
GFR, Estimated: >60 mL/min (ref >60)
Glucose, Bld: 120 mg/dL — ABNORMAL HIGH (ref 70–99)
Potassium: 3.2 mmol/L — ABNORMAL LOW (ref 3.5–5.1)
Sodium: 136 mmol/L (ref 135–145)
Total Bilirubin: 0.9 mg/dL (ref 0.3–1.2)
Total Protein: 6.8 g/dL (ref 6.5–8.1)

## 2020-09-25 LAB — POC SARS CORONAVIRUS 2 AG -  ED: SARS Coronavirus 2 Ag: NEGATIVE

## 2020-09-25 LAB — D-DIMER, QUANTITATIVE: D-Dimer, Quant: 3.63 ug/mL-FEU — ABNORMAL HIGH (ref 0.00–0.50)

## 2020-09-25 LAB — LACTIC ACID, PLASMA: Lactic Acid, Venous: 1.6 mmol/L (ref 0.5–1.9)

## 2020-09-25 MED ORDER — ACETAMINOPHEN 325 MG PO TABS
650.0000 mg | ORAL_TABLET | Freq: Once | ORAL | Status: AC
  Administered 2020-09-25: 650 mg via ORAL

## 2020-09-25 MED ORDER — POTASSIUM CHLORIDE CRYS ER 20 MEQ PO TBCR
40.0000 meq | EXTENDED_RELEASE_TABLET | Freq: Once | ORAL | Status: AC
  Administered 2020-09-25: 40 meq via ORAL
  Filled 2020-09-25: qty 2

## 2020-09-25 MED ORDER — ACETAMINOPHEN 325 MG PO TABS
650.0000 mg | ORAL_TABLET | Freq: Once | ORAL | Status: AC
  Administered 2020-09-25: 650 mg via ORAL
  Filled 2020-09-25: qty 2

## 2020-09-25 MED ORDER — BENZONATATE 100 MG PO CAPS
200.0000 mg | ORAL_CAPSULE | Freq: Once | ORAL | Status: AC
  Administered 2020-09-25: 200 mg via ORAL
  Filled 2020-09-25: qty 2

## 2020-09-25 MED ORDER — BENZONATATE 100 MG PO CAPS
200.0000 mg | ORAL_CAPSULE | Freq: Once | ORAL | Status: DC
  Filled 2020-09-25: qty 2

## 2020-09-25 NOTE — ED Provider Notes (Signed)
[facility]   MRN: [id number] DOB: [birth year]  Subjective:   Tyler Reed is a 70 y.o. male presenting for 1 month history of persistent and worsening cough, shortness of breath, throat pain, fever.  Patient is not COVID vaccinated.  They did have a COVID test on Friday and results are pending.  Denies history of lung disorders.  No current facility-administered medications for this encounter.  Current Outpatient Medications:  .  SYNTHROID 100 MCG tablet, TAKE 1/2 TABLET IN AM ON EMPTY STOMACH ONCE A DAY FOR THYROID (NAME BRAND ONLY DAW), Disp: , Rfl: 11   No Known Allergies  Past Medical History:  Diagnosis Date  . Bursitis, knee    right  . Hemorrhoid   . Thyroid disease      Past Surgical History:  Procedure Laterality Date  . FRACTURE SURGERY    . LACERATION REPAIR     reattached right "pinky"  . SKIN DEBRIDEMENT  10/10/11   full thickness; degloving right index finger  . SKIN DEBRIDEMENT  10/10/2011   Procedure: DEBRIDEMENT SKIN FULL THICKNESS;  Surgeon: Sharma Covert, MD;  Location: [facility];  Service: Orthopedics;  Laterality: Right;  Right Index Finger    No family history on file.  Social History   Tobacco Use  . Smoking status: Never Smoker  . Smokeless tobacco: Never Used  Substance Use Topics  . Alcohol use: Yes    Alcohol/week: 4.0 standard drinks    Types: 4 Cans of beer per week    Comment: on weekends  . Drug use: No    ROS   Objective:   Vitals: BP 134/75 (BP Location: Left Arm)   Pulse 75   Temp 100.2 F (37.9 C) (Oral)   Resp (!) 22   Ht 5\' 1"  (1.549 m)   Wt 167 lb 12.3 oz (76.1 kg)   SpO2 90%   BMI 31.70 kg/m   Physical Exam Constitutional:      General: He is not in acute distress.    Appearance: Normal appearance. He is well-developed and normal weight. He is ill-appearing. He is not toxic-appearing or diaphoretic.  HENT:     Head: Normocephalic and atraumatic.     Right Ear: External ear normal.     Left Ear:  External ear normal.     Nose: Nose normal.     Mouth/Throat:     Mouth: Mucous membranes are moist.     Pharynx: Oropharynx is clear.  Eyes:     General: No scleral icterus.       Right eye: No discharge.        Left eye: No discharge.     Extraocular Movements: Extraocular movements intact.     Pupils: Pupils are equal, round, and reactive to light.  Cardiovascular:     Rate and Rhythm: Normal rate and regular rhythm.     Heart sounds: Normal heart sounds. No murmur heard. No friction rub. No gallop.   Pulmonary:     Effort: Pulmonary effort is normal. No respiratory distress.     Breath sounds: No stridor. Decreased breath sounds and rales present. No wheezing or rhonchi.  Musculoskeletal:     Cervical back: Normal range of motion.  Skin:    Coloration: Skin is pale. Skin is not jaundiced.  Neurological:     Mental Status: He is alert and oriented to person, place, and time.  Psychiatric:        Mood and Affect: Mood normal.  Behavior: Behavior normal.        Thought Content: Thought content normal.        Judgment: Judgment normal.     DG Chest 2 View  Result Date: 09/25/2020 CLINICAL DATA:  Shortness of breath, dizziness and body aches. EXAM: CHEST - 2 VIEW COMPARISON:  None. FINDINGS: Normal cardiac and mediastinal contours. Extensive patchy bilateral areas of consolidation. No pleural effusion or pneumothorax. Osseous structures unremarkable. IMPRESSION: Extensive bilateral patchy areas of consolidation may represent multifocal pneumonia or atypical infectious process. These results will be called to the ordering clinician or representative by the Radiologist Assistant, and communication documented in the PACS or [employer]. Electronically Signed   By: Annia Belt M.D.   On: 09/25/2020 13:02     Assessment and Plan :   PDMP not reviewed this encounter.  1. Multifocal pneumonia   2. Hypoxemia   3. Viral syndrome     Pulse oximetry on room air ranged  between 86 to 90%.  X-ray demonstrates multifocal pneumonia.  Discussed this with patient and his daughter, emphasized need for evaluation in the emergency room, suspect he will be hospitalized.  Initially his daughter was not receptive to this but ultimately agreed to transport him to the hospital.  Refused transport by EMS.   Wallis Bamberg, PA-C 09/25/20 1347

## 2020-09-25 NOTE — Discharge Instructions (Addendum)
Patient scheduled for outpatient Remdesivir infusions at 4:30pm on Wednesday 1/26 at [facility]. Please park [address] [place], as staff will be escorting the patient through the east entrance of the hospital.Appointments take approximately 45 minutes.   There is a wave flag banner located near the entrance on [address]. Turn into this entranceand immediatelyturn left or right and park in 1 of the 10 designated Covid Infusion Parking spots. There is a phone number on the sign, please call and let the staff know what spot you are in and we will come out and get you. For questions call [phone]. Thanks.   You should Quarantine for 21 days from when your symptoms started.   You came to the emergency department today with reports of Covid-19 like symptoms.   You reported that you tested positive for COVID-19 from a test preformed outside of the hospital. Please isolate at home for at least 5 days after the day your symptoms initially began, and THEN at least 24 hours after you are fever-free without the help of medications (Tylenol/acetaminophen and Advil/ibuprofen/Motrin) AND your symptoms are improving.  You can alternate Tylenol/acetaminophen and Advil/ibuprofen/Motrin every 4 hours for sore throat, body aches, headache or fever.  Drink plenty of water.  Use saline nasal spray for congestion. You can take Tessalon every 8 hours as needed for cough. Wash your hands frequently. Please rest as needed with frequent repositioning and ambulation as tolerated.    If you use a CPAP or BiPAP device for management of obstructive sleep apnea may continue to use it however use it when isolated from other individuals to avoid spread of COVID-19.   If you use a nebulizer administer medication such as albuterol you may continue to use it however only one isolated from other individuals to avoid the spread of COVID-19.  If your symptoms do not improve please follow-up with your  primary care provider or urgent care.  Return to the ER for significant shortness of breath, uncontrollable vomiting, severe chest pain, inability to tolerate fluids, changes in mental status such as confusion or other concerning symptoms.       You are scheduled for an outpatient Remdesivir infusion at 4:30pm on Wednesday 1/26 at [facility]. Please park at [address] [place], [place], as staff will be escorting you through the east entrance of the hospital. Appointments take approximately 45 minutes.    The address for the infusion clinic site is:  --GPS address is [address] - the parking is located near [employer] building where you will see  COVID19 Infusion feather banner marking the entrance to parking.   (see photos below)            --Enter into the 2nd entrance where the "wave, flag banner" is at the road. Turn into this 2nd entrance and immediately turn left to park in 1 of the 5 parking spots.   --Please stay in your car and call the desk for assistance inside [phone].   The day of your visit you should: Marland Kitchen Get plenty of rest the night before and drink plenty of water . Eat a light meal/snack before coming and take your medications as prescribed  . Wear warm, comfortable clothes with a shirt that can roll-up over the elbow (will need IV start).  . Wear a mask  . Consider bringing some activity to help pass the time

## 2020-09-25 NOTE — Discharge Instructions (Signed)
Please report to the hospital now as you have multifocal pneumonia, hypoxemia and will likely need respiratory help for what I suspect is COVID-19 or severe lung infection.

## 2020-09-25 NOTE — ED Provider Notes (Signed)
[facility] EMERGENCY DEPARTMENT Provider Note   CSN: [id number] Arrival date & time: 09/25/20  1346     History Chief Complaint  Patient presents with  . Pneumonia    Tyler Reed is a 70 y.o. male with a past medical history of thyroid disease (on Synthyroid).  Per chart review patient presents from urgent care where he was seen earlier today.  While at urgent care his oxygen saturation ranged between 86 to 90% on room air.  Chest x-ray performed and showed extensive bilateral patchy areas of consolidation may represent multifocal pneumonia or atypical infectious process.    Patient presents with chief complaint of cough, sore throat, and fevers.  Patient states that his cough started 1 month prior, has been getting progressively worse, cough is worse at night, no alleviating factors, cough is nonproductive.  Patient endorses sore throat which has been present over the last month as well.  Patient also endorses fevers however he is unable to state when they started.  Patient reports intermittent nausea over the last month however none at present.  Patient denies any chest pain, palpitations, leg swelling, hemoptysis, shortness of breath, dysuria, neck or back pain, nasal congestion.  Patient denies any history of lung disease.  Denies any known sick contacts.  Denies COVID-19 or influenza vaccination.  Denies any history of DVT or PE, cancer treatment, recent surgery, recent immobilization, unilateral leg swelling, hormone therapy.    This interview was conducted using a interpreter.    Spoke with patient's daughter at bedside with permission from patient.  She reports that patient has had fevers for the past 2 days.  She reports that he was tested for COVID-19 on Friday 09/23/20 and results came back today showing he was positive.  She reports that patient has had this nonproductive cough for the past month.    HPI     Past Medical History:  Diagnosis Date  .  Bursitis, knee    right  . Hemorrhoid   . Thyroid disease     There are no problems to display for this patient.   Past Surgical History:  Procedure Laterality Date  . FRACTURE SURGERY    . LACERATION REPAIR     reattached right "pinky"  . SKIN DEBRIDEMENT  10/10/11   full thickness; degloving right index finger  . SKIN DEBRIDEMENT  10/10/2011   Procedure: DEBRIDEMENT SKIN FULL THICKNESS;  Surgeon: Sharma Covert, MD;  Location: [facility] OR;  Service: Orthopedics;  Laterality: Right;  Right Index Finger       No family history on file.  Social History   Tobacco Use  . Smoking status: Never Smoker  . Smokeless tobacco: Never Used  Substance Use Topics  . Alcohol use: Yes    Alcohol/week: 4.0 standard drinks    Types: 4 Cans of beer per week    Comment: on weekends  . Drug use: No    Home Medications Prior to Admission medications   Medication Sig Start Date End Date Taking? Authorizing Provider  SYNTHROID 100 MCG tablet TAKE 1/2 TABLET IN AM ON EMPTY STOMACH ONCE A DAY FOR THYROID (NAME BRAND ONLY DAW) 05/28/17   [provider]    Allergies    Patient has no known allergies.  Review of Systems   Review of Systems  Constitutional: Positive for appetite change (decreased) and fever. Negative for chills.  HENT: Positive for sore throat. Negative for congestion and rhinorrhea.   Eyes: Negative for visual disturbance.  Respiratory: Positive for cough (Nonproductive). Negative for shortness of breath.   Cardiovascular: Negative for chest pain, palpitations and leg swelling.  Gastrointestinal: Positive for constipation (last BM 2 days prior) and nausea (Intermittent over the last month, none at present). Negative for abdominal pain and vomiting.  Genitourinary: Negative for difficulty urinating and dysuria.  Musculoskeletal: Negative for back pain and neck pain.  Skin: Negative for color change and rash.  Neurological: Negative for dizziness, syncope, light-headedness  and headaches.  Psychiatric/Behavioral: Negative for confusion.    Physical Exam Updated Vital Signs BP 126/70   Pulse 67   Temp 98.2 F (36.8 C) (Oral)   Resp 20   SpO2 97%   Physical Exam Vitals and nursing note reviewed.  Constitutional:      General: He is not in acute distress.    Appearance: He is not ill-appearing, toxic-appearing or diaphoretic.  HENT:     Head: Normocephalic.     Mouth/Throat:     Pharynx: Oropharynx is clear. Uvula midline. Posterior oropharyngeal erythema present. No oropharyngeal exudate or uvula swelling.     Tonsils: No tonsillar exudate or tonsillar abscesses. 2+ on the right. 2+ on the left.  Eyes:     General: No scleral icterus.       Right eye: No discharge.        Left eye: No discharge.  Cardiovascular:     Rate and Rhythm: Normal rate.     Pulses:          Dorsalis pedis pulses are 3+ on the right side and 3+ on the left side.     Heart sounds: Normal heart sounds.  Pulmonary:     Effort: Pulmonary effort is normal. No tachypnea, bradypnea, accessory muscle usage or respiratory distress.     Breath sounds: Normal breath sounds. No stridor. No decreased breath sounds, wheezing, rhonchi or rales.  Abdominal:     General: Abdomen is protuberant. There is no distension.     Palpations: Abdomen is soft. There is no mass or pulsatile mass.     Tenderness: There is no abdominal tenderness. There is no guarding.  Musculoskeletal:     Cervical back: Neck supple.     Right lower leg: No swelling or tenderness. No edema.     Left lower leg: No swelling or tenderness. No edema.  Skin:    General: Skin is warm and dry.  Neurological:     General: No focal deficit present.     Mental Status: He is alert.  Psychiatric:        Behavior: Behavior is cooperative.     ED Results / Procedures / Treatments   Labs (all labs ordered are listed, but only abnormal results are displayed) Labs Reviewed  CBC WITH DIFFERENTIAL/PLATELET - Abnormal;  Notable for the following components:      Result Value   Hemoglobin 11.7 (*)    HCT 37.2 (*)    All other components within normal limits  COMPREHENSIVE METABOLIC PANEL - Abnormal; Notable for the following components:   Potassium 3.2 (*)    Glucose, Bld 120 (*)    Calcium 8.4 (*)    Albumin 3.0 (*)    AST 47 (*)    Alkaline Phosphatase 37 (*)    All other components within normal limits  D-DIMER, QUANTITATIVE (NOT AT [facility]) - Abnormal; Notable for the following components:   D-Dimer, Quant 3.63 (*)    All other components within normal limits  CULTURE, BLOOD (ROUTINE X  2)  CULTURE, BLOOD (ROUTINE X 2)  LACTIC ACID, PLASMA  LACTIC ACID, PLASMA  POC SARS CORONAVIRUS 2 AG -  ED    EKG EKG Interpretation  Date/Time:  Sunday September 25 2020 21:25:58 EST Ventricular Rate:  68 PR Interval:    QRS Duration: 90 QT Interval:  362 QTC Calculation: 385 R Axis:   14 Text Interpretation: Sinus rhythm unchanged compared to prior in 2013 Confirmed by Tilden Fossa [phone]) on 09/25/2020 10:03:43 PM   Radiology DG Chest 2 View  Result Date: 09/25/2020 CLINICAL DATA:  Shortness of breath, dizziness and body aches. EXAM: CHEST - 2 VIEW COMPARISON:  None. FINDINGS: Normal cardiac and mediastinal contours. Extensive patchy bilateral areas of consolidation. No pleural effusion or pneumothorax. Osseous structures unremarkable. IMPRESSION: Extensive bilateral patchy areas of consolidation may represent multifocal pneumonia or atypical infectious process. These results will be called to the ordering clinician or representative by the Radiologist Assistant, and communication documented in the PACS or [employer]. Electronically Signed   By: Annia Belt M.D.   On: 09/25/2020 13:02    Procedures Procedures (including critical care time)  Medications Ordered in ED Medications  potassium chloride SA (KLOR-CON) CR tablet 40 mEq (40 mEq Oral Given 09/25/20 2341)  benzonatate (TESSALON) capsule  200 mg (200 mg Oral Given 09/25/20 2341)  acetaminophen (TYLENOL) tablet 650 mg (650 mg Oral Given 09/25/20 2341)    ED Course  I have reviewed the triage vital signs and the nursing notes.  Pertinent labs & imaging results that were available during my care of the patient were reviewed by me and considered in my medical decision making (see chart for details).    MDM Rules/Calculators/A&P                          This interview was conducted using [place] interpreter.  Patient is an alert and oriented 70 year old male in no acute distress, nontoxic appearing.  Patient presents with chief complaint of worsening nonproductive cough x1 month, sore throat x1 month, and fever.  Patient was seen earlier today urgent care.  Per chart review patient was noted to have oxygen saturation 86 to 90% on room air.  Chest x-ray performed and showed extensive bilateral patchy areas of consolidation may represent multifocal pneumonia or atypical infectious process.    Patient denies any shortness of breath, chest pain, leg swelling.    Patient denies any history of lung disease.  Denies any known sick contacts.  Denies any vaccination for COVID-19 or influenza.  Patient's daughter reports he was tested for COVID-19 on Friday with results coming back today showing positive for coronavirus.  Reports patient has been febrile for the past 2 days.  Patient received Tylenol while at urgent care earlier today for his fever.  She confirms 1 month of nonproductive dry cough.  CBC, CMP, blood culture, and lactic acid ordered while patient was in waiting room.  Lactic acid 1.6.  CBC shows hemoglobin at 11.7 and hematocrit at 37.2.  CMP shows potassium slightly low at 3.2, bili slightly elevated at 120.    SPO2 at room air on arrival was 93%.  When reassessed with patient in room found to be 97% on room air.  Patient's lungs clear to auscultation bilaterally.  No swelling, edema, or tenderness to bilateral lower  extremities.  Swelling and erythema noted to patient's tonsils and oropharynx, no exudate present.   EKG and D-dimer ordered and pending.  EKG shows  sinus rhythm, unchanged from prior.  D-dimer elevated at 3.63.  With patient's complaints and elevated D-dimer CT angio chest ordered and pending.    With ambulation oxygen saturation dropped to 90%, respiratory rate 30, patient on room air.  If no PE found on CTA chest and vitals stable will discharge home.  Will give patient ambulatory covid 19 treatment referral.  Will send patient home with a prescription tessalon.  Will have patient follow up with primary care provider.    Patient was discussed with and evaluated by Dr. Madilyn Hook.  Patient care transferred to PA Mia McDonald at the end of my shift. Patient presentation, ED course, and plan of care discussed with review of all pertinent labs and imaging. Please see his/her note for further details regarding further ED course and disposition.  Tyler Reed was evaluated in Emergency Department on 09/25/2020 for the symptoms described in the history of present illness. He was evaluated in the context of the global COVID-19 pandemic, which necessitated consideration that the patient might be at risk for infection with the SARS-CoV-2 virus that causes COVID-19. Institutional protocols and algorithms that pertain to the evaluation of patients at risk for COVID-19 are in a state of rapid change based on information released by regulatory bodies including the CDC and federal and state organizations. These policies and algorithms were followed during the patient's care in the ED.   Final Clinical Impression(s) / ED Diagnoses Final diagnoses:  None    Rx / DC Orders ED Discharge Orders    None       Berneice Heinrich 09/26/20 0009    Tilden Fossa, MD 09/29/20 0830

## 2020-09-25 NOTE — ED Notes (Signed)
Son Edward Qualia [phone] available to translate if necessary

## 2020-09-25 NOTE — ED Provider Notes (Incomplete)
[facility] EMERGENCY DEPARTMENT Provider Note   CSN: [id number] Arrival date & time: 09/25/20  1346     History Chief Complaint  Patient presents with  . Pneumonia    Tyler Reed is a 70 y.o. male with a past medical history of thyroid disease (on Synthyroid).  Per chart review patient presents from urgent care where he was seen earlier today.  While at urgent care his oxygen saturation ranged between 86 to 90% on room air.  Chest x-ray performed and showed extensive bilateral patchy areas of consolidation may represent multifocal pneumonia or atypical infectious process.    Patient presents with chief complaint of cough, sore throat, and fevers.  Patient states that his cough started 1 month prior, has been getting progressively worse, cough is worse at night, no alleviating factors, cough is nonproductive.  Patient endorses sore throat which has been present over the last month as well.  Patient also endorses fevers however he is unable to state when they started.  Patient reports intermittent nausea over the last month however none at present.  Patient denies any chest pain, palpitations, leg swelling, hemoptysis, shortness of breath, dysuria, neck or back pain, nasal congestion.  Patient denies any history of lung disease.  Denies any known sick contacts.  Denies COVID-19 or influenza vaccination.  Denies any history of DVT or PE, cancer treatment, recent surgery, recent immobilization, unilateral leg swelling, hormone therapy.    This interview was conducted using a interpreter.    Spoke with patient's daughter at bedside with permission from patient.  She reports that patient has had fevers for the past 2 days.  She reports that he was tested for COVID-19 on Friday 09/23/20 and results came back today showing he was positive.  She reports that patient has had this nonproductive cough for the past month.    HPI     Past Medical History:  Diagnosis Date  .  Bursitis, knee    right  . Hemorrhoid   . Thyroid disease     There are no problems to display for this patient.   Past Surgical History:  Procedure Laterality Date  . FRACTURE SURGERY    . LACERATION REPAIR     reattached right "pinky"  . SKIN DEBRIDEMENT  10/10/11   full thickness; degloving right index finger  . SKIN DEBRIDEMENT  10/10/2011   Procedure: DEBRIDEMENT SKIN FULL THICKNESS;  Surgeon: Sharma Covert, MD;  Location: [facility] OR;  Service: Orthopedics;  Laterality: Right;  Right Index Finger       No family history on file.  Social History   Tobacco Use  . Smoking status: Never Smoker  . Smokeless tobacco: Never Used  Substance Use Topics  . Alcohol use: Yes    Alcohol/week: 4.0 standard drinks    Types: 4 Cans of beer per week    Comment: on weekends  . Drug use: No    Home Medications Prior to Admission medications   Medication Sig Start Date End Date Taking? Authorizing Provider  SYNTHROID 100 MCG tablet TAKE 1/2 TABLET IN AM ON EMPTY STOMACH ONCE A DAY FOR THYROID (NAME BRAND ONLY DAW) 05/28/17   [provider]    Allergies    Patient has no known allergies.  Review of Systems   Review of Systems  Constitutional: Positive for appetite change (decreased) and fever. Negative for chills.  HENT: Positive for sore throat. Negative for congestion and rhinorrhea.   Eyes: Negative for visual disturbance.  Respiratory: Positive for cough (Nonproductive). Negative for shortness of breath.   Cardiovascular: Negative for chest pain, palpitations and leg swelling.  Gastrointestinal: Positive for constipation (last BM 2 days prior) and nausea (Intermittent over the last month, none at present). Negative for abdominal pain and vomiting.  Genitourinary: Negative for difficulty urinating and dysuria.  Musculoskeletal: Negative for back pain and neck pain.  Skin: Negative for color change and rash.  Neurological: Negative for dizziness, syncope, light-headedness  and headaches.  Psychiatric/Behavioral: Negative for confusion.    Physical Exam Updated Vital Signs BP 126/70   Pulse 67   Temp 98.2 F (36.8 C) (Oral)   Resp 20   SpO2 97%   Physical Exam Vitals and nursing note reviewed.  Constitutional:      General: He is not in acute distress.    Appearance: He is not ill-appearing, toxic-appearing or diaphoretic.  HENT:     Head: Normocephalic.     Mouth/Throat:     Pharynx: Oropharynx is clear. Uvula midline. Posterior oropharyngeal erythema present. No oropharyngeal exudate or uvula swelling.     Tonsils: No tonsillar exudate or tonsillar abscesses. 2+ on the right. 2+ on the left.  Eyes:     General: No scleral icterus.       Right eye: No discharge.        Left eye: No discharge.  Cardiovascular:     Rate and Rhythm: Normal rate.     Pulses:          Dorsalis pedis pulses are 3+ on the right side and 3+ on the left side.     Heart sounds: Normal heart sounds.  Pulmonary:     Effort: Pulmonary effort is normal. No tachypnea, bradypnea, accessory muscle usage or respiratory distress.     Breath sounds: Normal breath sounds. No stridor. No decreased breath sounds, wheezing, rhonchi or rales.  Abdominal:     General: Abdomen is protuberant. There is no distension.     Palpations: Abdomen is soft. There is no mass or pulsatile mass.     Tenderness: There is no abdominal tenderness. There is no guarding.  Musculoskeletal:     Cervical back: Neck supple.     Right lower leg: No swelling or tenderness. No edema.     Left lower leg: No swelling or tenderness. No edema.  Skin:    General: Skin is warm and dry.  Neurological:     General: No focal deficit present.     Mental Status: He is alert.  Psychiatric:        Behavior: Behavior is cooperative.     ED Results / Procedures / Treatments   Labs (all labs ordered are listed, but only abnormal results are displayed) Labs Reviewed  CBC WITH DIFFERENTIAL/PLATELET - Abnormal;  Notable for the following components:      Result Value   Hemoglobin 11.7 (*)    HCT 37.2 (*)    All other components within normal limits  COMPREHENSIVE METABOLIC PANEL - Abnormal; Notable for the following components:   Potassium 3.2 (*)    Glucose, Bld 120 (*)    Calcium 8.4 (*)    Albumin 3.0 (*)    AST 47 (*)    Alkaline Phosphatase 37 (*)    All other components within normal limits  CULTURE, BLOOD (ROUTINE X 2)  CULTURE, BLOOD (ROUTINE X 2)  LACTIC ACID, PLASMA  LACTIC ACID, PLASMA  D-DIMER, QUANTITATIVE (NOT AT [facility])  POC SARS CORONAVIRUS 2 AG -  ED    EKG EKG Interpretation  Date/Time:  Sunday September 25 2020 21:25:58 EST Ventricular Rate:  68 PR Interval:    QRS Duration: 90 QT Interval:  362 QTC Calculation: 385 R Axis:   14 Text Interpretation: Sinus rhythm unchanged compared to prior in 2013 Confirmed by Tilden Fossaees, Elizabeth [phone]([id number]) on 09/25/2020 10:03:43 PM   Radiology DG Chest 2 View  Result Date: 09/25/2020 CLINICAL DATA:  Shortness of breath, dizziness and body aches. EXAM: CHEST - 2 VIEW COMPARISON:  None. FINDINGS: Normal cardiac and mediastinal contours. Extensive patchy bilateral areas of consolidation. No pleural effusion or pneumothorax. Osseous structures unremarkable. IMPRESSION: Extensive bilateral patchy areas of consolidation may represent multifocal pneumonia or atypical infectious process. These results will be called to the ordering clinician or representative by the Radiologist Assistant, and communication documented in the PACS or [employer]. Electronically Signed   By: Annia Beltrew  Davis M.D.   On: 09/25/2020 13:02    Procedures Procedures (including critical care time)  Medications Ordered in ED Medications  benzonatate (TESSALON) capsule 200 mg (has no administration in time range)    ED Course  I have reviewed the triage vital signs and the nursing notes.  Pertinent labs & imaging results that were available during my care of the  patient were reviewed by me and considered in my medical decision making (see chart for details).    MDM Rules/Calculators/A&P                          This interview was conducted using [place] interpreter.  Patient is an alert and oriented 70 year old male in no acute distress, nontoxic appearing.  Patient presents with chief complaint of worsening nonproductive cough x1 month, sore throat x1 month, and fever.  Patient was seen earlier today urgent care.  Per chart review patient was noted to have oxygen saturation 86 to 90% on room air.  Chest x-ray performed and showed extensive bilateral patchy areas of consolidation may represent multifocal pneumonia or atypical infectious process.    Patient denies any shortness of breath, chest pain, leg swelling.    Patient denies any history of lung disease.  Denies any known sick contacts.  Denies any vaccination for COVID-19 or influenza.  Patient's daughter reports he was tested for COVID-19 on Friday with results coming back today showing positive for coronavirus.  Reports patient has been febrile for the past 2 days.  Patient received Tylenol while at urgent care earlier today for his fever.  She confirms 1 month of nonproductive dry cough.  CBC, CMP, blood culture, and lactic acid ordered while patient was in waiting room.  Lactic acid 1.6.  CBC shows hemoglobin at 11.7 and hematocrit at 37.2.  CMP shows potassium slightly low at 3.2, bili slightly elevated at 120.    SPO2 at room air on arrival was 93%.  When reassessed with patient in room found to be 97% on room air.  Patient's lungs clear to auscultation bilaterally.  No swelling, edema, or tenderness to bilateral lower extremities.  Swelling and erythema noted to patient's tonsils and oropharynx, no exudate present.   EKG and D-dimer ordered and pending.  EKG shows sinus rhythm, unchanged from prior.  D-dimer elevated at 3.63.  With patient's complaints and elevated D-dimer CT angio chest  ordered and pending.    With ambulation oxygen saturation dropped to 90%, respiratory rate 30, patient on room air.  If no PE found on CTA chest vitals stable  will discharge home.  Will give patient ambulatory covid 19 treatment referral.  Will send patient home with a prescription tessalon.  Will have patient follow up with primary care provider.    Patient was discussed with and evaluated by Dr. Madilyn Hook.   Tyler Reed was evaluated in Emergency Department on 09/25/2020 for the symptoms described in the history of present illness. He was evaluated in the context of the global COVID-19 pandemic, which necessitated consideration that the patient might be at risk for infection with the SARS-CoV-2 virus that causes COVID-19. Institutional protocols and algorithms that pertain to the evaluation of patients at risk for COVID-19 are in a state of rapid change based on information released by regulatory bodies including the CDC and federal and state organizations. These policies and algorithms were followed during the patient's care in the ED.   Final Clinical Impression(s) / ED Diagnoses Final diagnoses:  None    Rx / DC Orders ED Discharge Orders    None

## 2020-09-25 NOTE — ED Triage Notes (Signed)
Pt brought in by daughter with c/o of SOB, cough, sorethroat, fever x 1 mo. Tested on Friday and results are pending.

## 2020-09-25 NOTE — ED Triage Notes (Signed)
Arrived from [facility]; advised to come to ED, concern for PNA.

## 2020-09-25 NOTE — ED Notes (Signed)
Daughter just received Covid results as positive.

## 2020-09-26 ENCOUNTER — Emergency Department (HOSPITAL_COMMUNITY): Payer: Managed Care, Other (non HMO)

## 2020-09-26 ENCOUNTER — Encounter (HOSPITAL_COMMUNITY): Payer: Self-pay | Admitting: Internal Medicine

## 2020-09-26 ENCOUNTER — Inpatient Hospital Stay (HOSPITAL_COMMUNITY): Payer: Managed Care, Other (non HMO)

## 2020-09-26 DIAGNOSIS — E876 Hypokalemia: Secondary | ICD-10-CM | POA: Diagnosis present

## 2020-09-26 DIAGNOSIS — U071 COVID-19: Secondary | ICD-10-CM

## 2020-09-26 DIAGNOSIS — K209 Esophagitis, unspecified without bleeding: Secondary | ICD-10-CM | POA: Diagnosis present

## 2020-09-26 DIAGNOSIS — D649 Anemia, unspecified: Secondary | ICD-10-CM | POA: Diagnosis present

## 2020-09-26 DIAGNOSIS — R7989 Other specified abnormal findings of blood chemistry: Secondary | ICD-10-CM

## 2020-09-26 DIAGNOSIS — Z6835 Body mass index (BMI) 35.0-35.9, adult: Secondary | ICD-10-CM | POA: Diagnosis not present

## 2020-09-26 DIAGNOSIS — J9601 Acute respiratory failure with hypoxia: Secondary | ICD-10-CM

## 2020-09-26 DIAGNOSIS — E119 Type 2 diabetes mellitus without complications: Secondary | ICD-10-CM | POA: Diagnosis present

## 2020-09-26 DIAGNOSIS — E039 Hypothyroidism, unspecified: Secondary | ICD-10-CM

## 2020-09-26 DIAGNOSIS — J189 Pneumonia, unspecified organism: Secondary | ICD-10-CM

## 2020-09-26 DIAGNOSIS — E669 Obesity, unspecified: Secondary | ICD-10-CM | POA: Diagnosis present

## 2020-09-26 DIAGNOSIS — J1282 Pneumonia due to coronavirus disease 2019: Secondary | ICD-10-CM | POA: Diagnosis present

## 2020-09-26 LAB — PROCALCITONIN: Procalcitonin: 0.26 ng/mL

## 2020-09-26 LAB — LACTATE DEHYDROGENASE: LDH: 403 U/L — ABNORMAL HIGH (ref 98–192)

## 2020-09-26 LAB — TRIGLYCERIDES: Triglycerides: 67 mg/dL (ref <150)

## 2020-09-26 LAB — FERRITIN: Ferritin: 182 ng/mL (ref 24–336)

## 2020-09-26 LAB — FIBRINOGEN: Fibrinogen: >800 mg/dL — ABNORMAL HIGH (ref 210–475)

## 2020-09-26 LAB — SARS CORONAVIRUS 2 BY RT PCR (HOSPITAL ORDER, PERFORMED IN ~~LOC~~ HOSPITAL LAB): SARS Coronavirus 2: POSITIVE — AB

## 2020-09-26 LAB — C-REACTIVE PROTEIN: CRP: 19.8 mg/dL — ABNORMAL HIGH (ref <1.0)

## 2020-09-26 MED ORDER — ZINC SULFATE 220 (50 ZN) MG PO CAPS
220.0000 mg | ORAL_CAPSULE | Freq: Every day | ORAL | Status: DC
  Administered 2020-09-26 – 2020-09-27 (×2): 220 mg via ORAL
  Filled 2020-09-26 (×2): qty 1

## 2020-09-26 MED ORDER — GUAIFENESIN-DM 100-10 MG/5ML PO SYRP
10.0000 mL | ORAL_SOLUTION | ORAL | Status: DC | PRN
  Administered 2020-09-26 – 2020-09-27 (×3): 10 mL via ORAL
  Filled 2020-09-26 (×3): qty 10

## 2020-09-26 MED ORDER — METHYLPREDNISOLONE SODIUM SUCC 125 MG IJ SOLR
80.0000 mg | Freq: Once | INTRAMUSCULAR | Status: AC
  Administered 2020-09-26: 80 mg via INTRAVENOUS
  Filled 2020-09-26: qty 2

## 2020-09-26 MED ORDER — ALBUTEROL SULFATE HFA 108 (90 BASE) MCG/ACT IN AERS
8.0000 | INHALATION_SPRAY | Freq: Once | RESPIRATORY_TRACT | Status: AC
  Administered 2020-09-26: 8 via RESPIRATORY_TRACT
  Filled 2020-09-26: qty 6.7

## 2020-09-26 MED ORDER — SODIUM CHLORIDE 0.9 % IV SOLN
100.0000 mg | Freq: Every day | INTRAVENOUS | Status: DC
  Administered 2020-09-27: 100 mg via INTRAVENOUS
  Filled 2020-09-26: qty 20

## 2020-09-26 MED ORDER — SODIUM CHLORIDE 0.9 % IV SOLN
200.0000 mg | Freq: Once | INTRAVENOUS | Status: AC
  Administered 2020-09-26: 200 mg via INTRAVENOUS
  Filled 2020-09-26: qty 200

## 2020-09-26 MED ORDER — METHYLPREDNISOLONE SODIUM SUCC 40 MG IJ SOLR
0.5000 mg/kg | Freq: Two times a day (BID) | INTRAMUSCULAR | Status: DC
  Administered 2020-09-26 – 2020-09-27 (×3): 38 mg via INTRAVENOUS
  Filled 2020-09-26 (×3): qty 1

## 2020-09-26 MED ORDER — AEROCHAMBER PLUS FLO-VU LARGE MISC
1.0000 | Freq: Once | Status: AC
  Administered 2020-09-26: 1

## 2020-09-26 MED ORDER — HYDROCOD POLST-CPM POLST ER 10-8 MG/5ML PO SUER
5.0000 mL | Freq: Two times a day (BID) | ORAL | Status: DC | PRN
  Administered 2020-09-26 (×2): 5 mL via ORAL
  Filled 2020-09-26 (×2): qty 5

## 2020-09-26 MED ORDER — LEVOTHYROXINE SODIUM 100 MCG PO TABS
100.0000 ug | ORAL_TABLET | Freq: Every day | ORAL | Status: DC
  Administered 2020-09-26 – 2020-09-27 (×2): 100 ug via ORAL
  Filled 2020-09-26 (×2): qty 1

## 2020-09-26 MED ORDER — ENOXAPARIN SODIUM 40 MG/0.4ML ~~LOC~~ SOLN
40.0000 mg | SUBCUTANEOUS | Status: DC
  Administered 2020-09-26 – 2020-09-27 (×2): 40 mg via SUBCUTANEOUS
  Filled 2020-09-26 (×2): qty 0.4

## 2020-09-26 MED ORDER — LIDOCAINE VISCOUS HCL 2 % MT SOLN
15.0000 mL | Freq: Once | OROMUCOSAL | Status: AC
  Administered 2020-09-26: 15 mL via OROMUCOSAL
  Filled 2020-09-26: qty 15

## 2020-09-26 MED ORDER — BARICITINIB 2 MG PO TABS
4.0000 mg | ORAL_TABLET | Freq: Every day | ORAL | Status: DC
  Administered 2020-09-26 – 2020-09-27 (×2): 4 mg via ORAL
  Filled 2020-09-26 (×2): qty 2

## 2020-09-26 MED ORDER — IOHEXOL 350 MG/ML SOLN
100.0000 mL | Freq: Once | INTRAVENOUS | Status: AC | PRN
  Administered 2020-09-26: 100 mL via INTRAVENOUS

## 2020-09-26 MED ORDER — ACETAMINOPHEN 325 MG PO TABS: 650.0000 mg | ORAL_TABLET | Freq: Four times a day (QID) | ORAL | Status: DC | PRN

## 2020-09-26 MED ORDER — PANTOPRAZOLE SODIUM 40 MG PO TBEC
40.0000 mg | DELAYED_RELEASE_TABLET | Freq: Every day | ORAL | Status: DC
  Administered 2020-09-26 – 2020-09-27 (×2): 40 mg via ORAL
  Filled 2020-09-26 (×2): qty 1

## 2020-09-26 MED ORDER — ALBUTEROL SULFATE HFA 108 (90 BASE) MCG/ACT IN AERS
2.0000 | INHALATION_SPRAY | RESPIRATORY_TRACT | Status: DC | PRN
  Filled 2020-09-26: qty 6.7

## 2020-09-26 MED ORDER — ASCORBIC ACID 500 MG PO TABS
500.0000 mg | ORAL_TABLET | Freq: Every day | ORAL | Status: DC
  Administered 2020-09-26 – 2020-09-27 (×2): 500 mg via ORAL
  Filled 2020-09-26: qty 1

## 2020-09-26 MED ORDER — VITAMIN D 25 MCG (1000 UNIT) PO TABS
1000.0000 [IU] | ORAL_TABLET | Freq: Every day | ORAL | Status: DC
  Administered 2020-09-26 – 2020-09-27 (×2): 1000 [IU] via ORAL
  Filled 2020-09-26 (×2): qty 1

## 2020-09-26 NOTE — Progress Notes (Signed)
No charge progress note.  Tyler Reed is a 70 y.o. male with medical history significant of hypothyroidism, obesity (BMI 35.70) presenting with complaints of fever, cough, shortness of breath, and fatigue for the past 3 days.  His wife is also sick.  He is not vaccinated against Covid.  Denies nausea, vomiting, abdominal pain, or diarrhea.   Patient has elevated inflammatory markers, COVID-19 PCR positive. Chest x-ray concerning for multifocal pneumonia.  CTA negative for PE but did show bilateral opacities.  He was started on remdesivir and steroid-day 1  Patient was initially requiring 2 L of oxygen, now stable at room air.  Continue with current management, should be able to discharge home tomorrow if remained stable on room air.

## 2020-09-26 NOTE — Progress Notes (Signed)
VASCULAR LAB    Bilateral lower extremity venous duplex has been performed.  See CV proc for preliminary results.   Allan Minotti, RVT 09/26/2020, 10:17 AM

## 2020-09-26 NOTE — H&P (Signed)
History and Physical    Tyler Reed JHE:[id number] DOB: Apr 22, 1951 DOA: 09/25/2020  PCP: Hali Marry, MD Patient coming from: Home  Chief Complaint: Fever, cough, shortness of breath  HPI: Tyler Reed is a 70 y.o. male with medical history significant of hypothyroidism, obesity (BMI 35.70) presenting with complaints of fever, cough, shortness of breath, and fatigue for the past 3 days.  His wife is also sick.  He is not vaccinated against Covid.  Denies nausea, vomiting, abdominal pain, or diarrhea.  Patient speaks [place] and interpreter services utilized to obtain history.  ED Course: Afebrile.  Not tachycardic or hypotensive.  Tachypneic.  Sats dropped to mid 80s on room air.  WBC 6.2, hemoglobin 11.7, MCV 84.9, platelet count 345K.  Sodium 136, potassium 3.2, chloride 98, bicarb 25, BUN 16, creatinine 0.9, glucose 120.  No significant elevation of LFTs.  Lactic acid 1.6.  Blood culture x2 pending.  D-dimer 3.63.  SARS-CoV-2 rapid antigen test negative, PCR test pending.  Procalcitonin pending.  Ferritin, fibrinogen, CRP, LDH pending.  Chest x-ray showing multifocal pneumonia.  CT angiogram negative for PE and showing multifocal pneumonia. Patient was given Tylenol, albuterol inhaler treatment, Tessalon, IV Solu-Medrol 80 mg, and oral potassium 40 mEq.  Review of Systems:  All systems reviewed and apart from history of presenting illness, are negative.  Past Medical History:  Diagnosis Date  . Bursitis, knee    right  . Hemorrhoid   . Thyroid disease     Past Surgical History:  Procedure Laterality Date  . FRACTURE SURGERY    . LACERATION REPAIR     reattached right "pinky"  . SKIN DEBRIDEMENT  10/10/11   full thickness; degloving right index finger  . SKIN DEBRIDEMENT  10/10/2011   Procedure: DEBRIDEMENT SKIN FULL THICKNESS;  Surgeon: Sharma Covert, MD;  Location: [facility] OR;  Service: Orthopedics;  Laterality: Right;  Right Index Finger     reports that he has never smoked. He has  never used smokeless tobacco. He reports current alcohol use of about 4.0 standard drinks of alcohol per week. He reports that he does not use drugs.  No Known Allergies  History reviewed. No pertinent family history.  Prior to Admission medications   Medication Sig Start Date End Date Taking? Authorizing Provider  SYNTHROID 100 MCG tablet Take 100 mcg by mouth See admin instructions. 1/2 tablet(55mcg) Monday-Friday. 1 tablet ( ) Saturday and Sunday. 05/28/17  Yes [provider]    Physical Exam: Vitals:   09/26/20 0345 09/26/20 0400 09/26/20 0415 09/26/20 0430  BP: 110/68 108/67 100/72 120/71  Pulse: 63 63 65 64  Resp: (!) 24 (!) 29 (!) 27 (!) 29  Temp:      TempSrc:      SpO2: 94% 94% 98% 96%    Physical Exam Constitutional:      Appearance: He is ill-appearing and diaphoretic.  HENT:     Head: Normocephalic and atraumatic.  Eyes:     Extraocular Movements: Extraocular movements intact.     Conjunctiva/sclera: Conjunctivae normal.  Cardiovascular:     Rate and Rhythm: Normal rate and regular rhythm.     Pulses: Normal pulses.  Pulmonary:     Breath sounds: Rales present. No wheezing.     Comments: Respiratory rate in the low to mid 20s Satting in the mid 90s on 2 L supplemental oxygen Bibasilar rales Abdominal:     General: Bowel sounds are normal. There is no distension.     Palpations: Abdomen is soft.  Tenderness: There is no abdominal tenderness.  Musculoskeletal:        General: No swelling or tenderness.     Cervical back: Normal range of motion and neck supple.  Skin:    General: Skin is warm.  Neurological:     General: No focal deficit present.     Mental Status: He is alert and oriented to person, place, and time.     Labs on Admission: I have personally reviewed following labs and imaging studies  CBC: Recent Labs  Lab 09/25/20 1532  WBC 6.2  NEUTROABS 5.1  HGB 11.7*  HCT 37.2*  MCV 84.9  PLT 345   Basic Metabolic  Panel: Recent Labs  Lab 09/25/20 1532  NA 136  K 3.2*  CL 98  CO2 25  GLUCOSE 120*  BUN 16  CREATININE 0.93  CALCIUM 8.4*   GFR: Estimated Creatinine Clearance: 66.5 mL/min (by C-G formula based on SCr of 0.93 mg/dL). Liver Function Tests: Recent Labs  Lab 09/25/20 1532  AST 47*  ALT 29  ALKPHOS 37*  BILITOT 0.9  PROT 6.8  ALBUMIN 3.0*   No results for input(s): LIPASE, AMYLASE in the last 168 hours. No results for input(s): AMMONIA in the last 168 hours. Coagulation Profile: No results for input(s): INR, PROTIME in the last 168 hours. Cardiac Enzymes: No results for input(s): CKTOTAL, CKMB, CKMBINDEX, TROPONINI in the last 168 hours. BNP (last 3 results) No results for input(s): PROBNP in the last 8760 hours. HbA1C: No results for input(s): HGBA1C in the last 72 hours. CBG: No results for input(s): GLUCAP in the last 168 hours. Lipid Profile: No results for input(s): CHOL, HDL, LDLCALC, TRIG, CHOLHDL, LDLDIRECT in the last 72 hours. Thyroid Function Tests: No results for input(s): TSH, T4TOTAL, FREET4, T3FREE, THYROIDAB in the last 72 hours. Anemia Panel: No results for input(s): VITAMINB12, FOLATE, FERRITIN, TIBC, IRON, RETICCTPCT in the last 72 hours. Urine analysis: No results found for: COLORURINE, APPEARANCEUR, LABSPEC, PHURINE, GLUCOSEU, HGBUR, BILIRUBINUR, KETONESUR, PROTEINUR, UROBILINOGEN, NITRITE, LEUKOCYTESUR  Radiological Exams on Admission: DG Chest 2 View  Result Date: 09/25/2020 CLINICAL DATA:  Shortness of breath, dizziness and body aches. EXAM: CHEST - 2 VIEW COMPARISON:  None. FINDINGS: Normal cardiac and mediastinal contours. Extensive patchy bilateral areas of consolidation. No pleural effusion or pneumothorax. Osseous structures unremarkable. IMPRESSION: Extensive bilateral patchy areas of consolidation may represent multifocal pneumonia or atypical infectious process. These results will be called to the ordering clinician or representative by  the Radiologist Assistant, and communication documented in the PACS or [employer]. Electronically Signed   By: Annia Belt M.D.   On: 09/25/2020 13:02   CT Angio Chest PE W/Cm &/Or Wo Cm  Result Date: 09/26/2020 CLINICAL DATA:  Positive D-dimer. Increased shortness of breath with COVID. Pulmonary embolus is suspected with low to intermediate probability. EXAM: CT ANGIOGRAPHY CHEST WITH CONTRAST TECHNIQUE: Multidetector CT imaging of the chest was performed using the standard protocol during bolus administration of intravenous contrast. Multiplanar CT image reconstructions and MIPs were obtained to evaluate the vascular anatomy. CONTRAST:  OMNIPAQUE IOHEXOL 350 MG/ML SOLN COMPARISON:  Chest radiograph 09/25/2020 FINDINGS: Cardiovascular: Good opacification of the central and segmental pulmonary arteries. No focal filling defects. No evidence of significant pulmonary embolus. Normal heart size. No pericardial effusions. Normal caliber thoracic aorta with calcification. No dissection. Great vessel origins are patent. Mediastinum/Nodes: Small esophageal hiatal hernia. Esophagus is decompressed. Distal esophageal wall is thickened, suggesting reflux disease or esophagitis. Prominent mediastinal lymph nodes. Subcarinal  nodes measure up to about 1.5 cm diameter. These are nonspecific but likely to be reactive. Lungs/Pleura: Patchy and confluent areas of airspace disease throughout the lungs with central sparing. Changes are consistent with COVID pneumonia. No pleural effusions. No pneumothorax. Airways are patent. Upper Abdomen: No acute abnormalities demonstrated in the visualized upper abdomen. Musculoskeletal: No chest wall abnormality. No acute or significant osseous findings. Review of the MIP images confirms the above findings. IMPRESSION: 1. No evidence of significant pulmonary embolus. 2. Patchy and confluent areas of airspace disease throughout the lungs with central sparing. Changes are consistent  with COVID pneumonia. 3. Small esophageal hiatal hernia. Distal esophageal wall is thickened, suggesting reflux disease or esophagitis. 4. Prominent mediastinal lymph nodes, likely reactive. 5. Aortic atherosclerosis. Aortic Atherosclerosis (ICD10-I70.0). Electronically Signed   By: Burman NievesWilliam  Stevens M.D.   On: 09/26/2020 00:23    EKG: Independently reviewed.  Sinus rhythm, baseline wander in V4-V5.  No significant change since prior tracing.  Assessment/Plan Principal Problem:   Multifocal pneumonia Active Problems:   Acute hypoxemic respiratory failure (HCC)   Hypothyroidism   Anemia   Hypokalemia   Acute hypoxemic respiratory failure secondary to multifocal pneumonia: Sats dropped to mid 80s on room air, currently satting in the mid 90s on 2 L supplemental oxygen.  Tachypneic with respiratory rate in the low to mid 20s.  CT angiogram chest negative for PE.  Covid rapid antigen test negative, however, showing patchy and confluent areas of airspace disease throughout the lungs with central sparing, pattern consistent with Covid pneumonia.  No fever, tachycardia, hypotension, or lactic acidosis to suggest sepsis.  Bacterial pneumonia less likely given no leukocytosis. -Given very high suspicion for Covid infection, PCR test ordered and currently pending.  If positive, start remdesivir and continue steroids (IV Solu-Medrol 0.5 mg/kg every 12 hours). -Again given very high suspicion for Covid infection, discussed FDA's emergency use authorization of baricitinib for the treatment of Covid infection in hospitalized patients requiring supplemental oxygen.  Patient denies history of hepatitis, HIV, malignancy, or active TB.  Risks versus benefits discussed with the patient and he wishes to proceed with using baricitinib if his Covid test comes back positive.  Start baricitinib 4 mg daily if SARS-CoV-2 PCR test is positive. -Vitamin C, zinc, vitamin D -Antitussives as needed -Tylenol as  needed -Bronchodilator as needed -Inflammatory markers including ferritin, fibrinogen, D-dimer, CRP, LDH currently pending. -Procalcitonin level pending. -Airborne and contact precautions -Incentive spirometry, flutter valve -Encourage prone positioning -Continuous pulse ox -Supplemental oxygen as needed to keep oxygen saturation above 90% -Blood culture x2 pending -Bilateral lower extremity Dopplers ordered to rule out DVT given elevated D-dimer  Hypothyroidism -Continue home Synthroid  Obesity (BMI 35.70) -Encourage lifestyle modifications-healthy eating and exercise to achieve weight loss after patient recovers from his acute illness.  Normocytic anemia: Hemoglobin 11.7, MCV 84.9.  Hemoglobin was 14.3 in October 2018, no recent labs for comparison.  Patient is not endorsing any symptoms of GI bleed. -Anemia panel, FOBT   Mild hypokalemia: Likely due to poor oral intake in the setting of acute illness. -Patient was given potassium supplementation in the ED.  Check magnesium level and replete if low.  Continue to monitor electrolytes.  Hiatal hernia/esophagitis: CT showing a small esophageal hiatal hernia.  The distal esophageal wall is thickened suggesting reflux disease or esophagitis. -Protonix 40 mg daily  DVT prophylaxis: Lovenox Code Status: Full code Family Communication: No family available at this time. Disposition Plan: Status is: Inpatient  Remains inpatient appropriate because:IV  treatments appropriate due to intensity of illness or inability to take PO and Inpatient level of care appropriate due to severity of illness   Dispo: The patient is from: Home              Anticipated d/c is to: Home              Anticipated d/c date is: 3 days              Patient currently is not medically stable to d/c.   Difficult to place patient No  The medical decision making on this patient was of high complexity and the patient is at high risk for clinical deterioration,  therefore this is a level 3 visit.  John Giovanni MD [facility]  If 7PM-7AM, please contact night-coverage [email or web address]  09/26/2020, 5:07 AM

## 2020-09-26 NOTE — ED Provider Notes (Signed)
70 year old male received a signout from [facility] pending CT PE study. Per his HPI:  "Tyler Reed is a 70 y.o. male with a past medical history of thyroid disease (on Synthyroid).  Per chart review patient presents from urgent care where he was seen earlier today.  While at urgent care his oxygen saturation ranged between 86 to 90% on room air.  Chest x-ray performed and showed extensive bilateral patchy areas of consolidation may represent multifocal pneumonia or atypical infectious process.    Patient presents with chief complaint of cough, sore throat, and fevers.  Patient states that his cough started 1 month prior, has been getting progressively worse, cough is worse at night, no alleviating factors, cough is nonproductive.  Patient endorses sore throat which has been present over the last month as well.  Patient also endorses fevers however he is unable to state when they started.  Patient reports intermittent nausea over the last month however none at present.  Patient denies any chest pain, palpitations, leg swelling, hemoptysis, shortness of breath, dysuria, neck or back pain, nasal congestion.  Patient denies any history of lung disease.  Denies any known sick contacts.  Denies COVID-19 or influenza vaccination.  Denies any history of DVT or PE, cancer treatment, recent surgery, recent immobilization, unilateral leg swelling, hormone therapy.    This interview was conducted using a interpreter.    Spoke with patient's daughter at bedside with permission from patient.  She reports that patient has had fevers for the past 2 days.  She reports that he was tested for COVID-19 on Friday 09/23/20 and results came back today showing he was positive.  She reports that patient has had this nonproductive cough for the past month."     Physical Exam  BP 110/68   Pulse 63   Temp 98.2 F (36.8 C) (Oral)   Resp (!) 24   SpO2 94%   Physical Exam  ED Course/Procedures   Clinical Course  as of 09/26/20 0347  Mon Sep 26, 2020  3329 Patient was observed at bedside by myself after pulse oximeter was repositioned.  Patient was noted to have oxygen saturation levels of 86 to 89% for several minutes while at rest with intermittent improvement to 91 to 93%, which persisted over the next 5 to 10 minutes while I was in the room updating the patient and his daughter on CT PE results and discussing plan of care.  The patient was placed on 2 L nasal cannula. [MM]    Clinical Course User Index [MM] Shalanda Brogden, Coral Else, PA-C       Procedures  MDM  70 year old male received a signout from [facility] pending PE study.  Please see his note for further work-up and medical decision making.  In brief, the patient tested positive for COVID-19 on January 21.  He was seen in urgent care earlier today and was noted to be hypoxic with oxygen saturation of 86 to 90% on room air and was sent to the emergency department for further work-up and evaluation.   Imaging has been reviewed and independently evaluated by me.  No evidence of PE.  There is an atypical multifocal pneumonia likely from COVID-19.  On my evaluation, patient with fluctuating oxygen levels.  He would remain at 86 to 89% with good waveform on the monitor for several minutes at rest and then intermittently increased to 90 to 93% before his oxygen level dropped again.  The patient was placed on 2 L nasal cannula.  COVID-19 preadmission labs were ordered and are currently pending.  We will start remdesivir, 1 meg per cake of methylprednisolone, and albuterol.  Consulted the hospitalist team and Dr. Loney Loh accept the patient for admission.  Patient's daughter did provide me with a copy of the COVID-19 test results from January 21, but the patient's name is not indicated on the test results.  We will repeat COVID-19 test as requested by hospitalist team.  The patient appears reasonably stabilized for admission considering the current  resources, flow, and capabilities available in the ED at this time, and I doubt any other [facility] requiring further screening and/or treatment in the ED prior to admission.   Barkley Boards, PA-C 09/26/20 0354    Marily Memos, MD 09/26/20 [phone]

## 2020-09-27 DIAGNOSIS — J189 Pneumonia, unspecified organism: Secondary | ICD-10-CM

## 2020-09-27 LAB — BASIC METABOLIC PANEL
Anion gap: 13 (ref 5–15)
BUN: 17 mg/dL (ref 8–23)
CO2: 22 mmol/L (ref 22–32)
Calcium: 8.6 mg/dL — ABNORMAL LOW (ref 8.9–10.3)
Chloride: 99 mmol/L (ref 98–111)
Creatinine, Ser: 0.88 mg/dL (ref 0.61–1.24)
GFR, Estimated: >60 mL/min (ref >60)
Glucose, Bld: 312 mg/dL — ABNORMAL HIGH (ref 70–99)
Potassium: 3.4 mmol/L — ABNORMAL LOW (ref 3.5–5.1)
Sodium: 134 mmol/L — ABNORMAL LOW (ref 135–145)

## 2020-09-27 LAB — IRON AND TIBC
Iron: 57 ug/dL (ref 45–182)
Saturation Ratios: 21 % (ref 17.9–39.5)
TIBC: 273 ug/dL (ref 250–450)
UIBC: 216 ug/dL

## 2020-09-27 LAB — HEMOGLOBIN A1C
Hgb A1c MFr Bld: 6.9 % — ABNORMAL HIGH (ref 4.8–5.6)
Mean Plasma Glucose: 151.33 mg/dL

## 2020-09-27 LAB — RETICULOCYTES
Immature Retic Fract: 8.7 % (ref 2.3–15.9)
RBC.: 3.86 MIL/uL — ABNORMAL LOW (ref 4.22–5.81)
Retic Count, Absolute: 23.9 10*3/uL (ref 19.0–186.0)
Retic Ct Pct: 0.6 % (ref 0.4–3.1)

## 2020-09-27 LAB — FOLATE: Folate: 12.1 ng/mL (ref >5.9)

## 2020-09-27 LAB — C-REACTIVE PROTEIN: CRP: 16 mg/dL — ABNORMAL HIGH (ref <1.0)

## 2020-09-27 LAB — FERRITIN: Ferritin: 269 ng/mL (ref 24–336)

## 2020-09-27 LAB — SARS-COV-2, NAA 2 DAY TAT

## 2020-09-27 LAB — MAGNESIUM: Magnesium: 2.2 mg/dL (ref 1.7–2.4)

## 2020-09-27 LAB — GLUCOSE, CAPILLARY: Glucose-Capillary: 265 mg/dL — ABNORMAL HIGH (ref 70–99)

## 2020-09-27 LAB — VITAMIN B12: Vitamin B-12: 969 pg/mL — ABNORMAL HIGH (ref 180–914)

## 2020-09-27 LAB — NOVEL CORONAVIRUS, NAA: SARS-CoV-2, NAA: NOT DETECTED

## 2020-09-27 LAB — HIV ANTIBODY (ROUTINE TESTING W REFLEX): HIV Screen 4th Generation wRfx: NONREACTIVE

## 2020-09-27 MED ORDER — VITAMIN D3 25 MCG PO TABS: 1000.0000 [IU] | ORAL_TABLET | Freq: Every day | ORAL | 0 refills | Status: AC

## 2020-09-27 MED ORDER — PANTOPRAZOLE SODIUM 40 MG PO TBEC: 40.0000 mg | DELAYED_RELEASE_TABLET | Freq: Every day | ORAL | 0 refills | Status: AC

## 2020-09-27 MED ORDER — ZINC SULFATE 220 (50 ZN) MG PO CAPS: 220.0000 mg | ORAL_CAPSULE | Freq: Every day | ORAL | 0 refills | Status: AC

## 2020-09-27 MED ORDER — GUAIFENESIN-DM 100-10 MG/5ML PO SYRP: 10.0000 mL | ORAL_SOLUTION | ORAL | 0 refills | Status: AC | PRN

## 2020-09-27 MED ORDER — ACETAMINOPHEN 325 MG PO TABS: 650.0000 mg | ORAL_TABLET | Freq: Four times a day (QID) | ORAL | 0 refills | Status: AC | PRN

## 2020-09-27 MED ORDER — POTASSIUM CHLORIDE CRYS ER 20 MEQ PO TBCR
40.0000 meq | EXTENDED_RELEASE_TABLET | Freq: Once | ORAL | Status: AC
  Administered 2020-09-27: 40 meq via ORAL
  Filled 2020-09-27: qty 2

## 2020-09-27 MED ORDER — ALBUTEROL SULFATE HFA 108 (90 BASE) MCG/ACT IN AERS: 2.0000 | INHALATION_SPRAY | RESPIRATORY_TRACT | 0 refills | Status: AC | PRN

## 2020-09-27 MED ORDER — METFORMIN HCL 500 MG PO TABS: 500.0000 mg | ORAL_TABLET | Freq: Two times a day (BID) | ORAL | 0 refills | Status: AC

## 2020-09-27 MED ORDER — INSULIN ASPART 100 UNIT/ML ~~LOC~~ SOLN
0.0000 [IU] | Freq: Three times a day (TID) | SUBCUTANEOUS | Status: DC
  Administered 2020-09-27: 8 [IU] via SUBCUTANEOUS

## 2020-09-27 MED ORDER — METFORMIN HCL 500 MG PO TABS: 500.0000 mg | ORAL_TABLET | Freq: Two times a day (BID) | ORAL | Status: DC

## 2020-09-27 MED ORDER — ASCORBIC ACID 500 MG PO TABS: 500.0000 mg | ORAL_TABLET | Freq: Every day | ORAL | 0 refills | Status: AC

## 2020-09-27 MED ORDER — PREDNISONE 10 MG PO TABS: 40.0000 mg | ORAL_TABLET | Freq: Every day | ORAL | 0 refills | Status: AC

## 2020-09-27 NOTE — Progress Notes (Signed)
Patient scheduled for outpatient Remdesivir infusions at 4:30pm on Wednesday 1/26 at [facility]. Please inform the patient to park at [address] [place], [place], as staff will be escorting the patient through the east entrance of the hospital. Appointments take approximately 45 minutes.    There is a wave flag banner located near the entrance on [address]. Turn into this entrance and immediately turn left or right and park in 1 of the 10 designated Covid Infusion Parking spots. There is a phone number on the sign, please call and let the staff know what spot you are in and we will come out and get you. For questions call [phone].  Thanks.

## 2020-09-27 NOTE — Discharge Summary (Signed)
Physician Discharge Summary  Tyler Reed YQM:[id number] DOB: [birth year] DOA: 09/25/2020  PCP: Hali MarryAltheimer, Michael, MD  Admit date: 09/25/2020 Discharge date: 09/27/2020  Admitted From: Home  Disposition:  Home   Recommendations for Outpatient Follow-up:  1. Follow up with PCP in 1-2 weeks 2. Please obtain BMP/CBC in one week 3. Will need further adjustment for Diabetes.  4. Needs to complete Remdesivir 1/16. 5. Needs follow up for esophagitis, dx on CT. 6. Needs further evaluation for anemia out patient.    Home Health: none  Discharge Condition: Stable.  CODE STATUS: Full code Diet recommendation: Carb Modified  Brief/Interim Summary: 70 year-old with past medical history significant for hypothyroidism obesity BMI 35 presents with fever cough shortness of breath and fatigue for 3 days.  He is not vaccinated against Covid. Evaluation in the ED he was tachypneic oxygenation level dropped to the 80s on room air.  Covid test positive.  Chest x-ray showed multifocal pneumonia.  CT angio negative for PE.  1-Acute hypoxic respiratory failure secondary to multifocal pneumonia: Patient received 2 days of IV steroids and Remdesivir.  Received 2 days of Baricitinib.  Subsequently he was wean  to room air.  His oxygen on ambulation currently 94 on room air. Patient stable to be discharged today.  Last dose of remdesivir  has been arranged for 1/26. Continue with 5 days of prednisone at discharge.  Robitussin, and multivitamins.  2-Diabetes: New diagnosis.  Hemoglobin A1c 6.9.  He will be discharged on Metformin twice a day.  3-Hypothyroidism: Continue with Synthroid. Needs lifestyle modification.  Hypokalemia: Replete orally. Anemia; Evaluation out patient.   Esophagitis; on Ct. Discharge on protonix.   Discharge Diagnoses:  Principal Problem:   Multifocal pneumonia Active Problems:   Acute hypoxemic respiratory failure (HCC)   Hypothyroidism   Anemia    Hypokalemia    Discharge Instructions  Discharge Instructions    Diet - low sodium heart healthy   Complete by: As directed    Increase activity slowly   Complete by: As directed      Allergies as of 09/27/2020   No Known Allergies     Medication List    TAKE these medications   acetaminophen 325 MG tablet Commonly known as: TYLENOL Take 2 tablets (650 mg total) by mouth every 6 (six) hours as needed for mild pain or headache (fever >/= 101).   albuterol 108 (90 Base) MCG/ACT inhaler Commonly known as: VENTOLIN HFA Inhale 2 puffs into the lungs every 4 (four) hours as needed for wheezing or shortness of breath.   ascorbic acid 500 MG tablet Commonly known as: VITAMIN C Take 1 tablet (500 mg total) by mouth daily. Start taking on: September 28, 2020   guaiFENesin-dextromethorphan 100-10 MG/5ML syrup Commonly known as: ROBITUSSIN DM Take 10 mLs by mouth every 4 (four) hours as needed for cough.   metFORMIN 500 MG tablet Commonly known as: GLUCOPHAGE Take 1 tablet (500 mg total) by mouth 2 (two) times daily with a meal. Start taking on: September 28, 2020   pantoprazole 40 MG tablet Commonly known as: PROTONIX Take 1 tablet (40 mg total) by mouth daily. Start taking on: September 28, 2020   predniSONE 10 MG tablet Commonly known as: DELTASONE Take 4 tablets (40 mg total) by mouth daily for 5 days.   Synthroid 100 MCG tablet Generic drug: levothyroxine Take 100 mcg by mouth See admin instructions. 1/2 tablet(3850mcg) Monday-Friday. 1 tablet (50mcg) Saturday and Sunday.   Vitamin D3 25 MCG tablet Commonly  known as: Vitamin D Take 1 tablet (1,000 Units total) by mouth daily. Start taking on: September 28, 2020   zinc sulfate 220 (50 Zn) MG capsule Take 1 capsule (220 mg total) by mouth daily. Start taking on: September 28, 2020       Follow-up Information    Altheimer, Casimiro Needle, MD Follow up in 1 week(s).   Specialty: Endocrinology Contact information: [facility] [place] [address] [phone]              No Known Allergies  Consultations:  None   Procedures/Studies: DG Chest 2 View  Result Date: 09/25/2020 CLINICAL DATA:  Shortness of breath, dizziness and body aches. EXAM: CHEST - 2 VIEW COMPARISON:  None. FINDINGS: Normal cardiac and mediastinal contours. Extensive patchy bilateral areas of consolidation. No pleural effusion or pneumothorax. Osseous structures unremarkable. IMPRESSION: Extensive bilateral patchy areas of consolidation may represent multifocal pneumonia or atypical infectious process. These results will be called to the ordering clinician or representative by the Radiologist Assistant, and communication documented in the PACS or [employer]. Electronically Signed   By: Annia Belt M.D.   On: 09/25/2020 13:02   CT Angio Chest PE W/Cm &/Or Wo Cm  Result Date: 09/26/2020 CLINICAL DATA:  Positive D-dimer. Increased shortness of breath with COVID. Pulmonary embolus is suspected with low to intermediate probability. EXAM: CT ANGIOGRAPHY CHEST WITH CONTRAST TECHNIQUE: Multidetector CT imaging of the chest was performed using the standard protocol during bolus administration of intravenous contrast. Multiplanar CT image reconstructions and MIPs were obtained to evaluate the vascular anatomy. CONTRAST:  OMNIPAQUE IOHEXOL 350 MG/ML SOLN COMPARISON:  Chest radiograph 09/25/2020 FINDINGS: Cardiovascular: Good opacification of the central and segmental pulmonary arteries. No focal filling defects. No evidence of significant pulmonary embolus. Normal heart size. No pericardial effusions. Normal caliber thoracic aorta with calcification. No dissection. Great vessel origins are patent. Mediastinum/Nodes: Small esophageal hiatal hernia. Esophagus is decompressed. Distal esophageal wall is thickened, suggesting reflux disease or esophagitis. Prominent mediastinal lymph nodes. Subcarinal nodes measure up to about 1.5 cm  diameter. These are nonspecific but likely to be reactive. Lungs/Pleura: Patchy and confluent areas of airspace disease throughout the lungs with central sparing. Changes are consistent with COVID pneumonia. No pleural effusions. No pneumothorax. Airways are patent. Upper Abdomen: No acute abnormalities demonstrated in the visualized upper abdomen. Musculoskeletal: No chest wall abnormality. No acute or significant osseous findings. Review of the MIP images confirms the above findings. IMPRESSION: 1. No evidence of significant pulmonary embolus. 2. Patchy and confluent areas of airspace disease throughout the lungs with central sparing. Changes are consistent with COVID pneumonia. 3. Small esophageal hiatal hernia. Distal esophageal wall is thickened, suggesting reflux disease or esophagitis. 4. Prominent mediastinal lymph nodes, likely reactive. 5. Aortic atherosclerosis. Aortic Atherosclerosis (ICD10-I70.0). Electronically Signed   By: Burman Nieves M.D.   On: 09/26/2020 00:23   VAS [place] LOWER EXTREMITY VENOUS (DVT)  Result Date: 09/26/2020  Lower Venous DVT Study Indications: Covid-19, edema, elevated D-Dimer.  Limitations: Lights on, as patient getting IV, patient constantly coughing. Comparison Study: No prior study Performing Technologist: Sherren Kerns RVS  Examination Guidelines: A complete evaluation includes B-mode imaging, spectral Doppler, color Doppler, and power Doppler as needed of all accessible portions of each vessel. Bilateral testing is considered an integral part of a complete examination. Limited examinations for reoccurring indications may be performed as noted. The reflux portion of the exam is performed with the patient in reverse Trendelenburg.  +---------+---------------+---------+-----------+----------+--------------+ RIGHT  CompressibilityPhasicitySpontaneityPropertiesThrombus Aging +---------+---------------+---------+-----------+----------+--------------+ CFV      Full            Yes      Yes                                 +---------+---------------+---------+-----------+----------+--------------+ SFJ      Full                                                        +---------+---------------+---------+-----------+----------+--------------+ FV Prox  Full                                                        +---------+---------------+---------+-----------+----------+--------------+ FV Mid   Full                                                        +---------+---------------+---------+-----------+----------+--------------+ FV DistalFull                                                        +---------+---------------+---------+-----------+----------+--------------+ PFV      Full                                                        +---------+---------------+---------+-----------+----------+--------------+ POP      Full           Yes      Yes                                 +---------+---------------+---------+-----------+----------+--------------+ PTV      Full                                                        +---------+---------------+---------+-----------+----------+--------------+ PERO     Full                                                        +---------+---------------+---------+-----------+----------+--------------+   +---------+---------------+---------+-----------+----------+--------------+ LEFT     CompressibilityPhasicitySpontaneityPropertiesThrombus Aging +---------+---------------+---------+-----------+----------+--------------+ CFV      Full           Yes      Yes                                 +---------+---------------+---------+-----------+----------+--------------+  SFJ      Full                                                        +---------+---------------+---------+-----------+----------+--------------+ FV Prox  Full                                                         +---------+---------------+---------+-----------+----------+--------------+ FV Mid   Full                                                        +---------+---------------+---------+-----------+----------+--------------+ FV DistalFull                                                        +---------+---------------+---------+-----------+----------+--------------+ PFV      Full                                                        +---------+---------------+---------+-----------+----------+--------------+ POP      Full           Yes      Yes                                 +---------+---------------+---------+-----------+----------+--------------+ PTV      Full                                                        +---------+---------------+---------+-----------+----------+--------------+ PERO     Full                                                        +---------+---------------+---------+-----------+----------+--------------+     Summary: BILATERAL: - No evidence of deep vein thrombosis seen in the lower extremities, bilaterally. -   *See table(s) above for measurements and observations. Electronically signed by Fabienne Bruns MD on 09/26/2020 at 6:18:14 PM.    Final      Subjective: He is feeling better, denies dyspnea.   Discharge Exam: Vitals:   09/27/20 0846 09/27/20 1137  BP:  123/71  Pulse:  64  Resp:  19  Temp:  97.6 F (36.4 C)  SpO2: 100% 96%     General: Pt is alert, awake, not in acute distress Cardiovascular: RRR, S1/S2 +, no rubs, no gallops Respiratory:  CTA bilaterally, no wheezing, no rhonchi Abdominal: Soft, NT, ND, bowel sounds + Extremities: no edema, no cyanosis    The results of significant diagnostics from this hospitalization (including imaging, microbiology, ancillary and laboratory) are listed below for reference.     Microbiology: Recent Results (from the past 240 hour(s))  Novel Coronavirus, NAA  ([facility])     Status: None   Collection Time: 09/25/20 12:42 PM   Specimen: Nasopharyngeal Swab; Nasopharyngeal(NP) swabs in vial transport medium   Nasopharynge  Patient  Result Value Ref Range Status   SARS-CoV-2, NAA Not Detected Not Detected Final    Comment: This nucleic acid amplification test was developed and its performance characteristics determined by [employer]. Nucleic acid amplification tests include RT-PCR and TMA. This test has not been FDA cleared or approved. This test has been authorized by FDA under an Emergency Use Authorization (EUA). This test is only authorized for the duration of time the declaration that circumstances exist justifying the authorization of the emergency use of in vitro diagnostic tests for detection of SARS-CoV-2 virus and/or diagnosis of COVID-19 infection under section 564(b)(1) of the Act, 21 U.S.C. 030SPQ-3(R) (1), unless the authorization is terminated or revoked sooner. When diagnostic testing is negative, the possibility of a false negative result should be considered in the context of a patient's recent exposures and the presence of clinical signs and symptoms consistent with COVID-19. An individual without symptoms of COVID-19 and who is not shedding SARS-CoV-2 virus wo uld expect to have a negative (not detected) result in this assay.   SARS-COV-2, NAA 2 DAY TAT     Status: None   Collection Time: 09/25/20 12:42 PM   Nasopharynge  Patient  Result Value Ref Range Status   SARS-CoV-2, NAA 2 DAY TAT Performed  Final  Blood culture (routine x 2)     Status: None (Preliminary result)   Collection Time: 09/25/20  3:32 PM   Specimen: BLOOD  Result Value Ref Range Status   Specimen Description BLOOD SITE NOT SPECIFIED  Final   Special Requests   Final    BOTTLES DRAWN AEROBIC AND ANAEROBIC Blood Culture adequate volume   Culture   Final    NO GROWTH < 24 HOURS Performed at [facility], [address], [place],  [place] [address]    Report Status PENDING  Incomplete  Blood culture (routine x 2)     Status: None (Preliminary result)   Collection Time: 09/25/20  9:30 PM   Specimen: BLOOD LEFT FOREARM  Result Value Ref Range Status   Specimen Description BLOOD LEFT FOREARM  Final   Special Requests   Final    BOTTLES DRAWN AEROBIC AND ANAEROBIC Blood Culture adequate volume   Culture   Final    NO GROWTH < 24 HOURS Performed at [facility], [address] [place], [place] [address]    Report Status PENDING  Incomplete  SARS Coronavirus 2 by RT PCR (hospital order, performed in [facility] hospital lab) Nasopharyngeal Nasopharyngeal Swab     Status: Abnormal   Collection Time: 09/26/20  4:14 AM   Specimen: Nasopharyngeal Swab  Result Value Ref Range Status   SARS Coronavirus 2 POSITIVE (A) NEGATIVE Final    Comment: RESULT CALLED TO, READ BACK BY AND VERIFIED WITH: RN KELLY MOON AT 0552 BY MESSAN H. ON 09/26/2020 (NOTE) SARS-CoV-2 target nucleic acids are DETECTED  SARS-CoV-2 RNA is generally detectable in upper respiratory specimens  during the acute phase of infection.  Positive results are indicative  of the presence of the identified virus, but do not rule out bacterial infection or co-infection with other pathogens not detected by the test.  Clinical correlation with patient history and  other diagnostic information is necessary to determine patient infection status.  The expected result is negative.  Fact Sheet for Patients:   BoilerBrush.com.cy   Fact Sheet for Healthcare Providers:   https://pope.com/    This test is not yet approved or cleared by the [place] FDA and  has been authorized for detection and/or diagnosis of SARS-CoV-2 by FDA under an Emergency Use Authorization (EUA).  This EUA will remain in effect (me aning this test can be used) for the duration of  the COVID-19 declaration under Section 564(b)(1) of the Act,  21 U.S.C. section 360-bbb-3(b)(1), unless the authorization is terminated or revoked sooner.  Performed at [facility], [address] [place], [place] [address]      Labs: BNP (last 3 results) No results for input(s): BNP in the last 8760 hours. Basic Metabolic Panel: Recent Labs  Lab 09/25/20 1532 09/27/20 0205 09/27/20 0612  NA 136 134*  --   K 3.2* 3.4*  --   CL 98 99  --   CO2 25 22  --   GLUCOSE 120* 312*  --   BUN 16 17  --   CREATININE 0.93 0.88  --   CALCIUM 8.4* 8.6*  --   MG  --   --  2.2   Liver Function Tests: Recent Labs  Lab 09/25/20 1532  AST 47*  ALT 29  ALKPHOS 37*  BILITOT 0.9  PROT 6.8  ALBUMIN 3.0*   No results for input(s): LIPASE, AMYLASE in the last 168 hours. No results for input(s): AMMONIA in the last 168 hours. CBC: Recent Labs  Lab 09/25/20 1532  WBC 6.2  NEUTROABS 5.1  HGB 11.7*  HCT 37.2*  MCV 84.9  PLT 345   Cardiac Enzymes: No results for input(s): CKTOTAL, CKMB, CKMBINDEX, TROPONINI in the last 168 hours. BNP: Invalid input(s): POCBNP CBG: Recent Labs  Lab 09/27/20 1140  GLUCAP 265*   D-Dimer Recent Labs    09/25/20 2135  DDIMER 3.63*   Hgb A1c Recent Labs    09/27/20 0612  HGBA1C 6.9*   Lipid Profile Recent Labs    09/26/20 0414  TRIG 67   Thyroid function studies No results for input(s): TSH, T4TOTAL, T3FREE, THYROIDAB in the last 72 hours.  Invalid input(s): FREET3 Anemia work up [employer]    09/26/20 0414 09/27/20 0612  VITAMINB12  --  969*  FOLATE  --  12.1  FERRITIN 182 269  TIBC  --  273  IRON  --  57  RETICCTPCT  --  0.6   Urinalysis No results found for: COLORURINE, APPEARANCEUR, LABSPEC, PHURINE, GLUCOSEU, HGBUR, BILIRUBINUR, KETONESUR, PROTEINUR, UROBILINOGEN, NITRITE, LEUKOCYTESUR Sepsis Labs Invalid input(s): PROCALCITONIN,  WBC,  LACTICIDVEN Microbiology Recent Results (from the past 240 hour(s))  Novel Coronavirus, NAA ([facility])     Status: None    Collection Time: 09/25/20 12:42 PM   Specimen: Nasopharyngeal Swab; Nasopharyngeal(NP) swabs in vial transport medium   Nasopharynge  Patient  Result Value Ref Range Status   SARS-CoV-2, NAA Not Detected Not Detected Final    Comment: This nucleic acid amplification test was developed and its performance characteristics determined by [employer]. Nucleic acid amplification tests include RT-PCR and TMA. This test has not been FDA cleared or approved. This test  has been authorized by FDA under an Emergency Use Authorization (EUA). This test is only authorized for the duration of time the declaration that circumstances exist justifying the authorization of the emergency use of in vitro diagnostic tests for detection of SARS-CoV-2 virus and/or diagnosis of COVID-19 infection under section 564(b)(1) of the Act, 21 U.S.C. 409WJX-9(J) (1), unless the authorization is terminated or revoked sooner. When diagnostic testing is negative, the possibility of a false negative result should be considered in the context of a patient's recent exposures and the presence of clinical signs and symptoms consistent with COVID-19. An individual without symptoms of COVID-19 and who is not shedding SARS-CoV-2 virus wo uld expect to have a negative (not detected) result in this assay.   SARS-COV-2, NAA 2 DAY TAT     Status: None   Collection Time: 09/25/20 12:42 PM   Nasopharynge  Patient  Result Value Ref Range Status   SARS-CoV-2, NAA 2 DAY TAT Performed  Final  Blood culture (routine x 2)     Status: None (Preliminary result)   Collection Time: 09/25/20  3:32 PM   Specimen: BLOOD  Result Value Ref Range Status   Specimen Description BLOOD SITE NOT SPECIFIED  Final   Special Requests   Final    BOTTLES DRAWN AEROBIC AND ANAEROBIC Blood Culture adequate volume   Culture   Final    NO GROWTH < 24 HOURS Performed at [facility], [address] [place], [place] [address]    Report Status  PENDING  Incomplete  Blood culture (routine x 2)     Status: None (Preliminary result)   Collection Time: 09/25/20  9:30 PM   Specimen: BLOOD LEFT FOREARM  Result Value Ref Range Status   Specimen Description BLOOD LEFT FOREARM  Final   Special Requests   Final    BOTTLES DRAWN AEROBIC AND ANAEROBIC Blood Culture adequate volume   Culture   Final    NO GROWTH < 24 HOURS Performed at [facility], [address] [place], [place] [address]    Report Status PENDING  Incomplete  SARS Coronavirus 2 by RT PCR (hospital order, performed in [facility] hospital lab) Nasopharyngeal Nasopharyngeal Swab     Status: Abnormal   Collection Time: 09/26/20  4:14 AM   Specimen: Nasopharyngeal Swab  Result Value Ref Range Status   SARS Coronavirus 2 POSITIVE (A) NEGATIVE Final    Comment: RESULT CALLED TO, READ BACK BY AND VERIFIED WITH: RN KELLY MOON AT 0552 BY MESSAN H. ON 09/26/2020 (NOTE) SARS-CoV-2 target nucleic acids are DETECTED  SARS-CoV-2 RNA is generally detectable in upper respiratory specimens  during the acute phase of infection.  Positive results are indicative  of the presence of the identified virus, but do not rule out bacterial infection or co-infection with other pathogens not detected by the test.  Clinical correlation with patient history and  other diagnostic information is necessary to determine patient infection status.  The expected result is negative.  Fact Sheet for Patients:   BoilerBrush.com.cy   Fact Sheet for Healthcare Providers:   https://pope.com/    This test is not yet approved or cleared by the [place] FDA and  has been authorized for detection and/or diagnosis of SARS-CoV-2 by FDA under an Emergency Use Authorization (EUA).  This EUA will remain in effect (me aning this test can be used) for the duration of  the COVID-19 declaration under Section 564(b)(1) of the Act, 21 U.S.C. section  360-bbb-3(b)(1),  unless the authorization is terminated or revoked sooner.  Performed at [facility], [address] [place], [place] [address]      Time coordinating discharge: 40 minutes  SIGNED:   Alba CoryBelkys A Cornelious Diven, MD  [facility]

## 2020-09-27 NOTE — Progress Notes (Signed)
Pt discharged home.  Discharge instructions explained to pt and daughter called too.  Both verbalize understanding.

## 2020-09-27 NOTE — Progress Notes (Signed)
Patient received to the unit. Patient is alert and oriented x4. Iv in place. Skin assessment done with another nurse. No skin breakdown noted during examination. Given instructions about call bell and phone. Bed in low position and call bell in reach.

## 2020-09-27 NOTE — Progress Notes (Signed)
SATURATION QUALIFICATIONS: (This note is used to comply with regulatory documentation for home oxygen)  Patient Saturations on Room Air at Rest = 98%  Patient Saturations on Room Air while Ambulating = 94%  Patient Saturations on 0 Liters of oxygen while Ambulating = 94%  Please briefly explain why patient needs home oxygen: 

## 2020-09-28 ENCOUNTER — Ambulatory Visit (HOSPITAL_COMMUNITY): Payer: Managed Care, Other (non HMO)

## 2020-09-29 ENCOUNTER — Ambulatory Visit (HOSPITAL_COMMUNITY)
Admission: RE | Admit: 2020-09-29 | Discharge: 2020-09-29 | Disposition: A | Discharge status: Home / Self Care | Payer: Managed Care, Other (non HMO) | Source: Ambulatory Visit | Attending: Pulmonary Disease | Admitting: Pulmonary Disease

## 2020-09-29 DIAGNOSIS — J9601 Acute respiratory failure with hypoxia: Secondary | ICD-10-CM | POA: Diagnosis not present

## 2020-09-29 DIAGNOSIS — J1289 Other viral pneumonia: Secondary | ICD-10-CM | POA: Diagnosis not present

## 2020-09-29 DIAGNOSIS — U071 COVID-19: Secondary | ICD-10-CM | POA: Diagnosis not present

## 2020-09-29 MED ORDER — SODIUM CHLORIDE 0.9 % IV SOLN: 100.0000 mg | Freq: Once | INTRAVENOUS | Status: DC

## 2020-09-29 MED ORDER — METHYLPREDNISOLONE SODIUM SUCC 125 MG IJ SOLR: 125.0000 mg | Freq: Once | INTRAMUSCULAR | Status: DC | PRN

## 2020-09-29 MED ORDER — FAMOTIDINE IN NACL 20-0.9 MG/50ML-% IV SOLN: 20.0000 mg | Freq: Once | INTRAVENOUS | Status: DC | PRN

## 2020-09-29 MED ORDER — SODIUM CHLORIDE 0.9 % IV SOLN: INTRAVENOUS | Status: DC | PRN

## 2020-09-29 MED ORDER — EPINEPHRINE 0.3 MG/0.3ML IJ SOAJ: 0.3000 mg | Freq: Once | INTRAMUSCULAR | Status: DC | PRN

## 2020-09-29 MED ORDER — DIPHENHYDRAMINE HCL 50 MG/ML IJ SOLN: 50.0000 mg | Freq: Once | INTRAMUSCULAR | Status: DC | PRN

## 2020-09-29 MED ORDER — ALBUTEROL SULFATE HFA 108 (90 BASE) MCG/ACT IN AERS: 2.0000 | INHALATION_SPRAY | Freq: Once | RESPIRATORY_TRACT | Status: DC | PRN

## 2020-09-29 MED ORDER — SODIUM CHLORIDE 0.9 % IV SOLN
100.0000 mg | Freq: Once | INTRAVENOUS | Status: AC
  Administered 2020-09-29: 100 mg via INTRAVENOUS

## 2020-09-29 NOTE — Discharge Instructions (Signed)

## 2020-09-29 NOTE — Progress Notes (Signed)
Patient reviewed Fact Sheet for Patients, Parents, and Caregivers for Emergency Use Authorization (EUA) of sotrovimab for the Treatment of Coronavirus. Patient also reviewed and is agreeable to the estimated cost of treatment. Patient is agreeable to proceed.   

## 2020-09-29 NOTE — Progress Notes (Signed)
  Diagnosis: COVID-19  Physician: Dr Wright  Procedure: [facility] Med: remdesivir infusion - Provided patient with remdesivir fact sheet for patients, parents and caregivers prior to infusion.  Complications: No immediate complications noted.  Discharge: Discharged home   Shigeru Lampert L 09/29/2020  

## 2020-09-30 LAB — CULTURE, BLOOD (ROUTINE X 2)
Culture: NO GROWTH
Culture: NO GROWTH
Special Requests: ADEQUATE
Special Requests: ADEQUATE

## 2020-10-04 NOTE — Progress Notes (Signed)
Acute hypoxic respiratory failure secondary to multifocal PNA secondary to covid 19 viral illness/   Thanks.

## 2022-04-12 ENCOUNTER — Ambulatory Visit
Admission: EM | Admit: 2022-04-12 | Discharge: 2022-04-12 | Disposition: A | Discharge status: Home / Self Care | Payer: Managed Care, Other (non HMO)

## 2022-04-12 DIAGNOSIS — K529 Noninfective gastroenteritis and colitis, unspecified: Secondary | ICD-10-CM

## 2022-04-12 NOTE — ED Triage Notes (Signed)
Pt presents with chills, vomiting, diarrhea, and weakness X 2 days.

## 2022-04-12 NOTE — ED Provider Notes (Signed)
[facility]    CSN: [id number] Arrival date & time: 04/12/22  9417      History   Chief Complaint Chief Complaint  Patient presents with   Chills   Emesis   Diarrhea   Weakness    HPI Tyler Reed is a 71 y.o. male.   Patient here today for evaluation of chills, vomiting and diarrhea that started 2 days ago.  He is here today with daughter who helps with translation.  He reports that he has not had any vomiting today so far but continues to have diarrhea.  He has not had fever.  He has generalized abdominal pain.  He does not report medication for treatment.  The history is provided by the patient.  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 Authorizing Provider  acetaminophen (TYLENOL) 325 MG tablet Take 2 tablets (650 mg total) by mouth every 6 (six) hours as needed for mild  pain or headache (fever >/= 101). 09/27/20   Regalado, Belkys A, MD  albuterol (VENTOLIN HFA) 108 (90 Base) MCG/ACT inhaler Inhale 2 puffs into the lungs every 4 (four) hours as needed for wheezing or shortness of breath. 09/27/20   Regalado, Belkys A, MD  ascorbic acid (VITAMIN C) 500 MG tablet Take 1 tablet (500 mg total) by mouth daily. 09/28/20   Regalado, Belkys A, MD  cholecalciferol (VITAMIN D) 25 MCG tablet Take 1 tablet (1,000 Units total) by mouth daily. 09/28/20   Regalado, Belkys A, MD  guaiFENesin-dextromethorphan (ROBITUSSIN DM) 100-10 MG/5ML syrup Take 10 mLs by mouth every 4 (four) hours as needed for cough. 09/27/20   Regalado, Jon Billings A, MD  metFORMIN (GLUCOPHAGE) 500 MG tablet Take 1 tablet (500 mg total) by mouth 2 (two) times daily with a meal. 09/28/20   Regalado, Belkys A, MD  pantoprazole (PROTONIX) 40 MG tablet Take 1 tablet (40 mg total) by mouth daily. 09/28/20   Regalado, Belkys A, MD  SYNTHROID 100 MCG tablet Take 100 mcg by mouth See admin instructions. 1/2 tablet(53mcg) Monday-Friday. 1 tablet ( ) Saturday and Sunday. 05/28/17   [provider]  zinc sulfate 220 (50 Zn) MG capsule Take 1 capsule (220 mg total) by mouth daily. 09/28/20   Regalado, Prentiss Bells, MD    Family History Family History  Family history  unknown: Yes    Social History Social History   Tobacco Use   Smoking status: Never   Smokeless tobacco: Never  Substance Use Topics   Alcohol use: Yes    Alcohol/week: 4.0 standard drinks of alcohol    Types: 4 Cans of beer per week    Comment: on weekends   Drug use: No     Allergies   Patient has no known allergies.   Review of Systems Review of Systems  Constitutional:  Negative for chills and fever.  Eyes:  Negative for discharge and redness.  Gastrointestinal:  Positive for abdominal pain, diarrhea, nausea and vomiting.  Neurological:  Positive for weakness (generalized).     Physical Exam Triage Vital Signs ED Triage Vitals   Enc Vitals Group     BP 04/12/22 0943 123/69     Pulse Rate 04/12/22 0943 63     Resp 04/12/22 0943 17     Temp 04/12/22 0943 98.1 F (36.7 C)     Temp Source 04/12/22 0943 Oral     SpO2 04/12/22 0943 95 %     Weight --      Height --      Head Circumference --      Peak Flow --      Pain Score 04/12/22 0942 1     Pain Loc --      Pain Edu? --      Excl. in [place]? --    No data found.  Updated Vital Signs BP 123/69 (BP Location: Left Arm)   Pulse 63   Temp 98.1 F (36.7 C) (Oral)   Resp 17   SpO2 95%      Physical Exam Vitals and nursing note reviewed.  Constitutional:      General: He is not in acute distress.    Appearance: Normal appearance. He is not ill-appearing.  HENT:     Head: Normocephalic and atraumatic.  Eyes:     Conjunctiva/sclera: Conjunctivae normal.  Cardiovascular:     Rate and Rhythm: Normal rate and regular rhythm.     Heart sounds: Normal heart sounds. No murmur heard. Pulmonary:     Effort: Pulmonary effort is normal. No respiratory distress.     Breath sounds: Normal breath sounds. No wheezing, rhonchi or rales.  Abdominal:     General: Abdomen is flat. Bowel sounds are normal. There is no distension.     Palpations: Abdomen is soft.     Tenderness: There is no abdominal tenderness. There is no guarding or rebound.  Skin:    General: Skin is warm and dry.  Neurological:     Mental Status: He is alert.  Psychiatric:        Mood and Affect: Mood normal.        Behavior: Behavior normal.      UC Treatments / Results  Labs (all labs ordered are listed, but only abnormal results are displayed) Labs Reviewed - No data to display  EKG   Radiology No results found.  Procedures Procedures (including critical care time)  Medications Ordered in UC Medications - No data to display  Initial Impression / Assessment and Plan / UC Course  I have reviewed the triage vital signs and the nursing notes.  Pertinent labs & imaging results  that were available during my care of the patient were reviewed by me and considered in my medical decision making (see chart for details).  Suspect viral etiology of symptoms.  Encouraged increased fluids  with electrolyte replacement and bland diet.  Recommended follow-up if diarrhea persist over the next 3 to 4 days or with any further concerns.  Final Clinical Impressions(s) / UC Diagnoses   Final diagnoses:  Gastroenteritis   Discharge Instructions   None    ED Prescriptions   None    PDMP not reviewed this encounter.   Tomi Bamberger, PA-C 04/12/22 1035
